# Patient Record
Sex: Female | Born: 1975 | Hispanic: Yes | Marital: Married | Smoking: Never smoker
Health system: Southern US, Community
[De-identification: ages and names within clinical notes are randomized; demographics above are authoritative.]

## PROBLEM LIST (undated history)

## (undated) DIAGNOSIS — M797 Fibromyalgia: Secondary | ICD-10-CM

## (undated) DIAGNOSIS — I10 Essential (primary) hypertension: Secondary | ICD-10-CM

## (undated) DIAGNOSIS — F329 Major depressive disorder, single episode, unspecified: Secondary | ICD-10-CM

## (undated) DIAGNOSIS — K7460 Unspecified cirrhosis of liver: Secondary | ICD-10-CM

## (undated) DIAGNOSIS — T7840XA Allergy, unspecified, initial encounter: Secondary | ICD-10-CM

## (undated) DIAGNOSIS — F32A Depression, unspecified: Secondary | ICD-10-CM

## (undated) DIAGNOSIS — C801 Malignant (primary) neoplasm, unspecified: Secondary | ICD-10-CM

## (undated) DIAGNOSIS — M1990 Unspecified osteoarthritis, unspecified site: Secondary | ICD-10-CM

## (undated) DIAGNOSIS — E7800 Pure hypercholesterolemia, unspecified: Secondary | ICD-10-CM

## (undated) DIAGNOSIS — I499 Cardiac arrhythmia, unspecified: Secondary | ICD-10-CM

## (undated) HISTORY — DX: Fibromyalgia: M79.7

## (undated) HISTORY — DX: Pure hypercholesterolemia, unspecified: E78.00

## (undated) HISTORY — DX: Depression, unspecified: F32.A

## (undated) HISTORY — PX: HERNIA REPAIR: SHX51

## (undated) HISTORY — PX: OTHER SURGICAL HISTORY: SHX169

## (undated) HISTORY — DX: Allergy, unspecified, initial encounter: T78.40XA

## (undated) HISTORY — PX: FRACTURE SURGERY: SHX138

## (undated) HISTORY — PX: WISDOM TOOTH EXTRACTION: SHX21

## (undated) HISTORY — DX: Major depressive disorder, single episode, unspecified: F32.9

## (undated) HISTORY — PX: CHOLECYSTECTOMY: SHX55

---

## 2004-04-08 HISTORY — PX: OTHER SURGICAL HISTORY: SHX169

## 2004-12-11 ENCOUNTER — Encounter: Admission: RE | Admit: 2004-12-11 | Discharge: 2004-12-11 | Payer: Self-pay | Admitting: Specialist

## 2004-12-19 ENCOUNTER — Ambulatory Visit (HOSPITAL_COMMUNITY): Admission: RE | Admit: 2004-12-19 | Discharge: 2004-12-19 | Payer: Self-pay | Admitting: Specialist

## 2009-08-23 ENCOUNTER — Encounter: Admission: RE | Admit: 2009-08-23 | Discharge: 2009-08-23 | Payer: Self-pay | Admitting: Gastroenterology

## 2010-08-24 NOTE — Op Note (Signed)
Elizabeth Norton, Elizabeth Norton               ACCOUNT NO.:  [id number]   MEDICAL RECORD NO.:  [id number]          PATIENT TYPE:  AMB   LOCATION:  DAY                          FACILITY:  [facility]   PHYSICIAN:  Kerrin Champagne, M.D.   DATE OF BIRTH:  Mar 01, 1976   DATE OF PROCEDURE:  12/19/2004  DATE OF DISCHARGE:                                 OPERATIVE REPORT   PREOPERATIVE DIAGNOSIS:  Dorsally tilting apex volar increasing angular  deformity of the right distal radius fracture.   POSTOPERATIVE DIAGNOSIS:  Dorsally tilting apex volar increasing angular  deformity of the right distal radius fracture.   PROCEDURE:  Closed manipulation right distal radius fracture with  percutaneous pinning using 2 x 0.625 K-wires.   SURGEON:  Kerrin Champagne, M.D.   ASSISTANT:  Maud Deed, P.A.-C.   ANESTHESIA:  Camie Patience, Jenelle Mages. Fortune, M.D.   ESTIMATED BLOOD LOSS:  0 cc.   DRAINS:  None.   CLINICAL HISTORY:  The patient is a 35 year old right-hand dominant female  who fell while roller blading some 2-1/2 weeks ago sustaining a right distal  radius fracture was intra-articular and then seven associated ulnar styloid  fracture.  The ulnar styloid fracture is nondisplaced.  The intra-articular  distal radius fracture component is minimally displaced on CT scan.  The  patient does have some slight spread of the scapholunate interval.  This is  comparable to that of the opposite unaffected side.  Because of increasing  angular deformity of the distal radius fracture and collapse of the dorsal  cortex due to comminution, the patient is brought to the operating room to  undergo a closed manipulation and percutaneous pinning to try and prevent  further angular deformity and reestablish length and alignment of the distal  radius.   DESCRIPTION OF PROCEDURE:  After adequate general anesthesia, the patient's  right upper extremity was prepped with DuraPrep solution from fingertips to  the upper forearm, draped  in the usual manner.  The mini C-arm was then  draped in the usual manner.  The C-arm was brought into field and  manipulation performed of the distal radius able to obtain neutral position  alignment of the distal radius fracture site.  An 0.625 K-wire was then  introduced at the tip of the radial styloid and this was then passed across  the distal radius fracture site extending likely from the radial styloid  from dorsal to volar. A similar pin was then passed 3 or 4 mm volar to the  opposite.  This crossed the other pin track and both of these were placed  into the ulnar side of the right distal radius cortex proximal to the  fracture site, fixing the fracture in good position alignment with pins.  Following documentation of the position alignment, following pinning then,  each of the pins were then bent and clipped, and the pin protectors were  then applied.  A portion of Xeroform and then 4x4s placed beneath the pins  to protect the skin.  A well-padded short-arm synthetic cast was then  applied.  The patient was then reactivated, extubated,  and returned recovery  room in satisfactory condition.   POSTOPERATIVE CARE:  The patient will keep the right wrist elevated for two  days.  She will apply ice to the dorsal aspect of the wrist for a period of  two days and use Vicodin one or two q.4-6h. p.r.n. pain and be seen back the  office in the next 1-2 weeks for a follow-up visit.      Kerrin Champagne, M.D.  Electronically Signed     JEN/MEDQ  D:  12/19/2004  T:  12/20/2004  Job:  [id number]

## 2016-12-25 ENCOUNTER — Telehealth: Payer: Self-pay

## 2016-12-25 NOTE — Telephone Encounter (Signed)
Called patient left message for return caaaal

## 2016-12-26 ENCOUNTER — Other Ambulatory Visit: Payer: Self-pay | Admitting: Family Medicine

## 2016-12-26 ENCOUNTER — Encounter: Payer: Self-pay | Admitting: Family Medicine

## 2016-12-26 ENCOUNTER — Telehealth: Payer: Self-pay | Admitting: Family Medicine

## 2016-12-26 ENCOUNTER — Other Ambulatory Visit: Payer: Self-pay

## 2016-12-26 ENCOUNTER — Ambulatory Visit (INDEPENDENT_AMBULATORY_CARE_PROVIDER_SITE_OTHER): Payer: Self-pay | Admitting: Family Medicine

## 2016-12-26 VITALS — BP 112/72 | HR 97 | Temp 97.8°F | Ht 66.0 in | Wt 238.1 lb

## 2016-12-26 DIAGNOSIS — R600 Localized edema: Secondary | ICD-10-CM

## 2016-12-26 DIAGNOSIS — R300 Dysuria: Secondary | ICD-10-CM

## 2016-12-26 LAB — COMPREHENSIVE METABOLIC PANEL
ALK PHOS: 108 U/L (ref 39–117)
ALT: 32 U/L (ref 0–35)
AST: 107 U/L — AB (ref 0–37)
Albumin: 2.5 g/dL — ABNORMAL LOW (ref 3.5–5.2)
BILIRUBIN TOTAL: 6.1 mg/dL — AB (ref 0.2–1.2)
BUN: 9 mg/dL (ref 6–23)
CO2: 25 mEq/L (ref 19–32)
CREATININE: 0.49 mg/dL (ref 0.40–1.20)
Calcium: 8.7 mg/dL (ref 8.4–10.5)
Chloride: 103 mEq/L (ref 96–112)
GFR: 146.66 mL/min (ref >60.00)
GLUCOSE: 92 mg/dL (ref 70–99)
Potassium: 3.7 mEq/L (ref 3.5–5.1)
Sodium: 133 mEq/L — ABNORMAL LOW (ref 135–145)
TOTAL PROTEIN: 6.2 g/dL (ref 6.0–8.3)

## 2016-12-26 LAB — URINALYSIS, ROUTINE W REFLEX MICROSCOPIC
HGB URINE DIPSTICK: NEGATIVE
KETONES UR: NEGATIVE
LEUKOCYTES UA: NEGATIVE
RBC / HPF: NONE SEEN (ref 0–?)
Specific Gravity, Urine: >=1.03 — AB (ref 1.000–1.030)
URINE GLUCOSE: NEGATIVE
Urobilinogen, UA: 1 (ref 0.0–1.0)
WBC UA: NONE SEEN (ref 0–?)
pH: 5.5 (ref 5.0–8.0)

## 2016-12-26 LAB — TSH: TSH: 4.03 u[IU]/mL (ref 0.35–4.50)

## 2016-12-26 NOTE — Addendum Note (Signed)
Addended by: Harley Alto on: 12/26/2016 12:35 PM   Modules accepted: Orders

## 2016-12-26 NOTE — Progress Notes (Signed)
Chief Complaint  Patient presents with  . Establish Care       New Patient Visit SUBJECTIVE: HPI: Elizabeth Norton is an 41 y.o.female who is being seen for establishing care.  1 week of b/l LE swelling, Started shortly after she began a new job. She's been standing a lot more. Her oral intake of salt and fluids is largely unchanged. She's been trying to elevate her legs at home. It is progressively worsening. Is up to her thighs bilaterally. Each leg is affected equally. She denies any history of heart failure, liver failure, renal failure. No injury or prolonged periods of inactivity. No recent medication changes or additions. She's not having any fevers, cough, chest pain, shortness of breath, or dyspnea on exertion.  Allergies  Allergen Reactions  . Penicillins Hives    Had as a child   Past Medical History:  Diagnosis Date  . Fibromyalgia    Past Surgical History:  Procedure Laterality Date  . right wrist fracture     Social History   Social History  . Marital status: Married   Social History Main Topics  . Smoking status: Never Smoker  . Smokeless tobacco: Never Used  . Alcohol use Yes     Comment: occasional  . Drug use: No   Family History  Problem Relation Age of Onset  . Hyperthyroidism Mother   . Hyperthyroidism Sister   . Hyperthyroidism Brother    Takes no medications.  Patient's last menstrual period was 12/10/2016 (approximate).  ROS Cardiovascular: Denies chest pain  Respiratory: Denies dyspnea   OBJECTIVE: BP 112/72 (BP Location: Left Arm, Patient Position: Sitting, Cuff Size: Large)   Pulse 97   Temp 97.8 F (36.6 C) (Oral)   Ht  (1.676 m)   Wt 238 lb 2 oz (108 kg)   LMP 12/10/2016 (Approximate)   SpO2 99%   BMI 38.43 kg/m   Constitutional: -  VS reviewed -  Well developed, well nourished, appears stated age -  No apparent distress  Psychiatric: -  Oriented to person, place, and time -  Memory intact -  Affect and mood normal -   Fluent conversation, good eye contact -  Judgment and insight age appropriate  Eye: -  Conjunctivae clear, no discharge -  Pupils symmetric, round, reactive to light  ENMT: -  MMM    Pharynx moist, no exudate, no erythema  Neck: -  No gross swelling, no palpable masses -  Thyroid midline, not enlarged, mobile, no palpable masses  Cardiovascular: -  RRR -  No bruits -  3+ pitting edema tapering to distal 1/4 of thighs b/l, diffuse TTP  Respiratory: -  Normal respiratory effort, no accessory muscle use, no retraction -  Breath sounds equal, no wheezes, no ronchi, no crackles  Neurological:  -  CN II - XII grossly intact -  Sensation grossly intact to light touch, equal bilaterally  Musculoskeletal: -  No clubbing, no cyanosis -  Neg Homan's b/l  Skin: -  No significant lesion on inspection -  Warm and dry to palpation   ASSESSMENT/PLAN: Bilateral leg edema - Plan: Comprehensive metabolic panel, TSH  Patient instructed to sign release of records form from her previous PCP. Orders as above, compression stocking rx given. Elevate legs. Mind salt intake. Walk as frequently as possible. Pending labs, may call in Lasix.  Patient should return in 4 weeks to recheck swelling. The patient voiced understanding and agreement to the plan.   Jilda Roche [place], DO  12/26/16  9:36 AM

## 2016-12-26 NOTE — Telephone Encounter (Signed)
Order entered/patient informed

## 2016-12-26 NOTE — Telephone Encounter (Signed)
That's fine. OK to place UA.

## 2016-12-26 NOTE — Progress Notes (Signed)
Pre visit review using our clinic review tool, if applicable. No additional management support is needed unless otherwise documented below in the visit note. 

## 2016-12-26 NOTE — Telephone Encounter (Signed)
°  Relation to MV:HQIO Call back number:[phone]  Reason for call:  Patient states she mentioned to CMA at today visit, she was experiencing frequent urination and would like to come back and drop off a urine on her lunch break at 12 noon, please advise

## 2016-12-26 NOTE — Patient Instructions (Addendum)
Stay very active and try to walk as much as possible  Continue to elevate legs.  Wear compression stockings at least 12 hours per day.  Check MyChart tomorrow and Saturday.  Let us know if you need anything.

## 2016-12-27 ENCOUNTER — Telehealth: Payer: Self-pay | Admitting: Family Medicine

## 2016-12-27 NOTE — Telephone Encounter (Signed)
Patient called to inquire of her results/labs and urine

## 2016-12-27 NOTE — Telephone Encounter (Signed)
Called the patient back as she had called again for results Had to leave her a message

## 2016-12-28 ENCOUNTER — Other Ambulatory Visit: Payer: Self-pay | Admitting: Family Medicine

## 2016-12-28 DIAGNOSIS — E871 Hypo-osmolality and hyponatremia: Secondary | ICD-10-CM

## 2016-12-28 DIAGNOSIS — R7401 Elevation of levels of liver transaminase levels: Secondary | ICD-10-CM

## 2016-12-28 DIAGNOSIS — R740 Nonspecific elevation of levels of transaminase and lactic acid dehydrogenase [LDH]: Principal | ICD-10-CM

## 2016-12-28 MED ORDER — FUROSEMIDE 20 MG PO TABS: 20.0000 mg | ORAL_TABLET | Freq: Every day | ORAL | 0 refills | Status: DC

## 2016-12-28 NOTE — Progress Notes (Signed)
Lasix, RUQ [place] and labs ordered per MyChart note.

## 2016-12-28 NOTE — Telephone Encounter (Signed)
Detailed MyChart message sent this AM. See result note for details.

## 2016-12-30 ENCOUNTER — Encounter: Payer: Self-pay | Admitting: Family Medicine

## 2016-12-31 ENCOUNTER — Encounter: Payer: Self-pay | Admitting: Family Medicine

## 2016-12-31 ENCOUNTER — Other Ambulatory Visit (INDEPENDENT_AMBULATORY_CARE_PROVIDER_SITE_OTHER): Payer: Self-pay

## 2016-12-31 DIAGNOSIS — R740 Nonspecific elevation of levels of transaminase and lactic acid dehydrogenase [LDH]: Secondary | ICD-10-CM

## 2016-12-31 DIAGNOSIS — E871 Hypo-osmolality and hyponatremia: Secondary | ICD-10-CM

## 2016-12-31 DIAGNOSIS — R7401 Elevation of levels of liver transaminase levels: Secondary | ICD-10-CM

## 2016-12-31 LAB — FERRITIN: FERRITIN: 205.2 ng/mL (ref 10.0–291.0)

## 2016-12-31 LAB — HEPATIC FUNCTION PANEL
ALBUMIN: 2.5 g/dL — AB (ref 3.5–5.2)
ALT: 32 U/L (ref 0–35)
AST: 105 U/L — AB (ref 0–37)
Alkaline Phosphatase: 116 U/L (ref 39–117)
BILIRUBIN TOTAL: 6.4 mg/dL — AB (ref 0.2–1.2)
Bilirubin, Direct: 2.5 mg/dL — ABNORMAL HIGH (ref 0.0–0.3)
Total Protein: 6.3 g/dL (ref 6.0–8.3)

## 2016-12-31 LAB — IBC PANEL
IRON: 155 ug/dL — AB (ref 42–145)
SATURATION RATIOS: 90 % — AB (ref 20.0–50.0)
TRANSFERRIN: 123 mg/dL — AB (ref 212.0–360.0)

## 2016-12-31 LAB — BASIC METABOLIC PANEL
BUN: 7 mg/dL (ref 6–23)
CHLORIDE: 105 meq/L (ref 96–112)
CO2: 26 mEq/L (ref 19–32)
Calcium: 8.9 mg/dL (ref 8.4–10.5)
Creatinine, Ser: 0.52 mg/dL (ref 0.40–1.20)
GFR: 136.93 mL/min (ref >60.00)
Glucose, Bld: 96 mg/dL (ref 70–99)
POTASSIUM: 3.7 meq/L (ref 3.5–5.1)
SODIUM: 139 meq/L (ref 135–145)

## 2016-12-31 NOTE — Telephone Encounter (Addendum)
Patient would like to speak with nurse directly or PCP regarding results please follow up via phone [phone] (M)

## 2017-01-01 ENCOUNTER — Other Ambulatory Visit: Payer: Self-pay | Admitting: Family Medicine

## 2017-01-01 ENCOUNTER — Telehealth: Payer: Self-pay | Admitting: Family Medicine

## 2017-01-01 LAB — HEPATITIS B SURFACE ANTIGEN: HEP B S AG: NONREACTIVE

## 2017-01-01 LAB — HEPATITIS C ANTIBODY
Hepatitis C Ab: NONREACTIVE
SIGNAL TO CUT-OFF: 0.03 (ref <1.00)

## 2017-01-01 MED ORDER — FUROSEMIDE 40 MG PO TABS: 40.0000 mg | ORAL_TABLET | Freq: Every day | ORAL | 3 refills | Status: DC

## 2017-01-01 NOTE — Telephone Encounter (Signed)
Called left message to call back. Advise on results.

## 2017-01-01 NOTE — Telephone Encounter (Signed)
Pt request order for [place] RUQ to go to [facility]. She says she is self pay and they are cheaper. Forwarded to referral coordinator. It was e-faxed as pt requested. Pt notified via return call [phone].

## 2017-01-01 NOTE — Progress Notes (Signed)
New dose of Lasix called in. Discussed stopping supp Fe. Rx for compression stockings also called in, pt made aware. She still needs to get RUQ [place].

## 2017-01-02 ENCOUNTER — Other Ambulatory Visit: Payer: Self-pay

## 2017-01-07 ENCOUNTER — Telehealth: Payer: Self-pay | Admitting: Family Medicine

## 2017-01-07 NOTE — Telephone Encounter (Signed)
Pt wants ultrasound results she had it at [facility] 01/06/17. Call pt with results. Pt wants a call today. Informed pt Carmelia Roller is out of the office and will return in the morning but pt wants someone to call her today. Sent to [place] and Abner Greenspan since Carmelia Roller is out of the office today.

## 2017-01-07 NOTE — Telephone Encounter (Signed)
Results have not been received yet. Once we receive results Pt will be informed of results.

## 2017-01-07 NOTE — Telephone Encounter (Signed)
Can you check to see if patients results are in

## 2017-01-09 NOTE — Telephone Encounter (Signed)
I called the patient informed to schedule appt to discuss results/she said she cannot get off work right now due to it being a new job.  Would you be able to call her?? Or message on mychart.

## 2017-01-09 NOTE — Telephone Encounter (Signed)
Spoke to the patient and she can be here Monday 01/13/2017 at 6:50 am.

## 2017-01-09 NOTE — Telephone Encounter (Signed)
I would like to see the patient in person to relay the results. If you can schedule her this afternoon, AM 1115 slot, or tomorrow, that would be ideal. TY.

## 2017-01-09 NOTE — Telephone Encounter (Signed)
Is there anyway someone can open the door at this time for the patient please? I am happy to get there early to do it if that is an issue, but I know either of you are usually already there. Thanks!

## 2017-01-09 NOTE — Telephone Encounter (Signed)
I have put her on your schedule for 01/13/2017 at 7 am---noted in appointment notes she will arrive at 6:50 AM.

## 2017-01-09 NOTE — Telephone Encounter (Signed)
Called left message to call back. PCP needs to know the earliest or latest she can come in.

## 2017-01-09 NOTE — Telephone Encounter (Signed)
Called left message to call back 

## 2017-01-10 NOTE — Telephone Encounter (Signed)
That day is Elizabeth Norton day to be here early.

## 2017-01-10 NOTE — Telephone Encounter (Signed)
I will be here to let the pt in

## 2017-01-13 ENCOUNTER — Ambulatory Visit: Payer: Self-pay | Admitting: Family Medicine

## 2017-01-13 DIAGNOSIS — Z0289 Encounter for other administrative examinations: Secondary | ICD-10-CM

## 2017-01-14 ENCOUNTER — Inpatient Hospital Stay (HOSPITAL_COMMUNITY)
Admission: EM | Admit: 2017-01-14 | Discharge: 2017-01-19 | DRG: 433 | Disposition: A | Discharge status: Home / Self Care | Payer: Self-pay | Attending: Oncology | Admitting: Oncology

## 2017-01-14 ENCOUNTER — Emergency Department (HOSPITAL_COMMUNITY): Payer: Self-pay

## 2017-01-14 ENCOUNTER — Encounter (HOSPITAL_COMMUNITY): Payer: Self-pay | Admitting: Emergency Medicine

## 2017-01-14 DIAGNOSIS — D689 Coagulation defect, unspecified: Secondary | ICD-10-CM | POA: Diagnosis present

## 2017-01-14 DIAGNOSIS — Z880 Allergy status to penicillin: Secondary | ICD-10-CM

## 2017-01-14 DIAGNOSIS — K760 Fatty (change of) liver, not elsewhere classified: Secondary | ICD-10-CM | POA: Diagnosis present

## 2017-01-14 DIAGNOSIS — R188 Other ascites: Secondary | ICD-10-CM | POA: Diagnosis present

## 2017-01-14 DIAGNOSIS — E876 Hypokalemia: Secondary | ICD-10-CM | POA: Diagnosis present

## 2017-01-14 DIAGNOSIS — E7800 Pure hypercholesterolemia, unspecified: Secondary | ICD-10-CM | POA: Diagnosis present

## 2017-01-14 DIAGNOSIS — D696 Thrombocytopenia, unspecified: Secondary | ICD-10-CM | POA: Diagnosis present

## 2017-01-14 DIAGNOSIS — Z79899 Other long term (current) drug therapy: Secondary | ICD-10-CM

## 2017-01-14 DIAGNOSIS — D539 Nutritional anemia, unspecified: Secondary | ICD-10-CM | POA: Diagnosis present

## 2017-01-14 DIAGNOSIS — Z91040 Latex allergy status: Secondary | ICD-10-CM

## 2017-01-14 DIAGNOSIS — D891 Cryoglobulinemia: Secondary | ICD-10-CM | POA: Diagnosis present

## 2017-01-14 DIAGNOSIS — K7460 Unspecified cirrhosis of liver: Principal | ICD-10-CM | POA: Diagnosis present

## 2017-01-14 DIAGNOSIS — M797 Fibromyalgia: Secondary | ICD-10-CM | POA: Diagnosis present

## 2017-01-14 DIAGNOSIS — E669 Obesity, unspecified: Secondary | ICD-10-CM | POA: Diagnosis present

## 2017-01-14 DIAGNOSIS — M310 Hypersensitivity angiitis: Secondary | ICD-10-CM | POA: Diagnosis present

## 2017-01-14 DIAGNOSIS — E8809 Other disorders of plasma-protein metabolism, not elsewhere classified: Secondary | ICD-10-CM | POA: Diagnosis present

## 2017-01-14 DIAGNOSIS — I776 Arteritis, unspecified: Secondary | ICD-10-CM

## 2017-01-14 DIAGNOSIS — F329 Major depressive disorder, single episode, unspecified: Secondary | ICD-10-CM | POA: Diagnosis present

## 2017-01-14 DIAGNOSIS — Z6834 Body mass index (BMI) 34.0-34.9, adult: Secondary | ICD-10-CM

## 2017-01-14 DIAGNOSIS — L03115 Cellulitis of right lower limb: Secondary | ICD-10-CM | POA: Diagnosis present

## 2017-01-14 DIAGNOSIS — M25561 Pain in right knee: Secondary | ICD-10-CM | POA: Diagnosis present

## 2017-01-14 DIAGNOSIS — R233 Spontaneous ecchymoses: Secondary | ICD-10-CM | POA: Diagnosis present

## 2017-01-14 DIAGNOSIS — E871 Hypo-osmolality and hyponatremia: Secondary | ICD-10-CM | POA: Diagnosis present

## 2017-01-14 DIAGNOSIS — R21 Rash and other nonspecific skin eruption: Secondary | ICD-10-CM | POA: Diagnosis present

## 2017-01-14 DIAGNOSIS — Z7289 Other problems related to lifestyle: Secondary | ICD-10-CM

## 2017-01-14 DIAGNOSIS — D692 Other nonthrombocytopenic purpura: Secondary | ICD-10-CM | POA: Diagnosis present

## 2017-01-14 HISTORY — DX: Unspecified cirrhosis of liver: K74.60

## 2017-01-14 LAB — COMPREHENSIVE METABOLIC PANEL
ALBUMIN: 2.6 g/dL — AB (ref 3.5–5.0)
ALK PHOS: 83 U/L (ref 38–126)
ALT: 34 U/L (ref 14–54)
ANION GAP: 14 (ref 5–15)
AST: 99 U/L — AB (ref 15–41)
BILIRUBIN TOTAL: 6.3 mg/dL — AB (ref 0.3–1.2)
BUN: 10 mg/dL (ref 6–20)
CALCIUM: 8.4 mg/dL — AB (ref 8.9–10.3)
CO2: 27 mmol/L (ref 22–32)
Chloride: 85 mmol/L — ABNORMAL LOW (ref 101–111)
Creatinine, Ser: 0.74 mg/dL (ref 0.44–1.00)
GFR calc Af Amer: >60 mL/min (ref >60)
GFR calc non Af Amer: >60 mL/min (ref >60)
GLUCOSE: 98 mg/dL (ref 65–99)
POTASSIUM: 2.7 mmol/L — AB (ref 3.5–5.1)
SODIUM: 126 mmol/L — AB (ref 135–145)
Total Protein: 7 g/dL (ref 6.5–8.1)

## 2017-01-14 LAB — URINALYSIS, ROUTINE W REFLEX MICROSCOPIC
BILIRUBIN URINE: NEGATIVE
Glucose, UA: NEGATIVE mg/dL
Hgb urine dipstick: NEGATIVE
KETONES UR: NEGATIVE mg/dL
Leukocytes, UA: NEGATIVE
NITRITE: NEGATIVE
PROTEIN: NEGATIVE mg/dL
SPECIFIC GRAVITY, URINE: 1.014 (ref 1.005–1.030)
pH: 5 (ref 5.0–8.0)

## 2017-01-14 LAB — SAVE SMEAR

## 2017-01-14 LAB — CBC WITH DIFFERENTIAL/PLATELET
BASOS ABS: 0 10*3/uL (ref 0.0–0.1)
Basophils Relative: 0 %
EOS ABS: 0.1 10*3/uL (ref 0.0–0.7)
EOS PCT: 1 %
HEMATOCRIT: 30.2 % — AB (ref 36.0–46.0)
HEMOGLOBIN: 11 g/dL — AB (ref 12.0–15.0)
Lymphocytes Relative: 18 %
Lymphs Abs: 1.7 10*3/uL (ref 0.7–4.0)
MCH: 36.5 pg — ABNORMAL HIGH (ref 26.0–34.0)
MCHC: 36.4 g/dL — AB (ref 30.0–36.0)
MCV: 100.3 fL — ABNORMAL HIGH (ref 78.0–100.0)
Monocytes Absolute: 1.1 10*3/uL — ABNORMAL HIGH (ref 0.1–1.0)
Monocytes Relative: 11 %
Neutro Abs: 6.5 10*3/uL (ref 1.7–7.7)
Neutrophils Relative %: 70 %
Platelets: 83 10*3/uL — ABNORMAL LOW (ref 150–400)
RBC: 3.01 MIL/uL — ABNORMAL LOW (ref 3.87–5.11)
RDW: 14.5 % (ref 11.5–15.5)
WBC: 9.4 10*3/uL (ref 4.0–10.5)

## 2017-01-14 LAB — SEDIMENTATION RATE: Sed Rate: 65 mm/hr — ABNORMAL HIGH (ref 0–22)

## 2017-01-14 LAB — BRAIN NATRIURETIC PEPTIDE: B NATRIURETIC PEPTIDE 5: 264.2 pg/mL — AB (ref 0.0–100.0)

## 2017-01-14 LAB — C-REACTIVE PROTEIN: CRP: 5.9 mg/dL — AB (ref <1.0)

## 2017-01-14 LAB — D-DIMER, QUANTITATIVE: D-Dimer, Quant: 9.72 ug/mL-FEU — ABNORMAL HIGH (ref 0.00–0.50)

## 2017-01-14 LAB — PROTIME-INR
INR: 2.15
PROTHROMBIN TIME: 23.9 s — AB (ref 11.4–15.2)

## 2017-01-14 LAB — FIBRINOGEN: FIBRINOGEN: 198 mg/dL — AB (ref 210–475)

## 2017-01-14 LAB — I-STAT CG4 LACTIC ACID, ED
Lactic Acid, Venous: 2.43 mmol/L (ref 0.5–1.9)
Lactic Acid, Venous: 1.82 mmol/L (ref 0.5–1.9)

## 2017-01-14 LAB — APTT: aPTT: 50 seconds — ABNORMAL HIGH (ref 24–36)

## 2017-01-14 MED ORDER — POTASSIUM CHLORIDE CRYS ER 20 MEQ PO TBCR: 40.0000 meq | EXTENDED_RELEASE_TABLET | Freq: Once | ORAL | Status: DC

## 2017-01-14 MED ORDER — VANCOMYCIN HCL IN DEXTROSE 1-5 GM/200ML-% IV SOLN
1000.0000 mg | Freq: Once | INTRAVENOUS | Status: AC
  Administered 2017-01-14: 1000 mg via INTRAVENOUS
  Filled 2017-01-14: qty 200

## 2017-01-14 MED ORDER — POTASSIUM CHLORIDE 10 MEQ/100ML IV SOLN
10.0000 meq | INTRAVENOUS | Status: AC
  Administered 2017-01-14 – 2017-01-15 (×2): 10 meq via INTRAVENOUS
  Filled 2017-01-14 (×2): qty 100

## 2017-01-14 MED ORDER — POTASSIUM CHLORIDE CRYS ER 20 MEQ PO TBCR
40.0000 meq | EXTENDED_RELEASE_TABLET | Freq: Once | ORAL | Status: AC
  Administered 2017-01-14: 40 meq via ORAL
  Filled 2017-01-14: qty 2

## 2017-01-14 MED ORDER — POTASSIUM CHLORIDE 10 MEQ/100ML IV SOLN
10.0000 meq | INTRAVENOUS | Status: AC
  Administered 2017-01-14: 10 meq via INTRAVENOUS
  Filled 2017-01-14: qty 100

## 2017-01-14 MED ORDER — DEXTROSE 5 % IV SOLN
2.0000 g | Freq: Three times a day (TID) | INTRAVENOUS | Status: DC
  Filled 2017-01-14: qty 2

## 2017-01-14 NOTE — ED Triage Notes (Signed)
Pt st's she was admitted 2 days ago for vasculitis in right lower leg at [facility].  Pt st's they wanted to transfer her to [facility] but she did not want to go so she signed out AMA earlier today.  Pt c/o pain and swelling to right lower leg.  Leg red and warm to touch.  Pt did receive one dose of Aztreonam while at [facility]

## 2017-01-14 NOTE — H&P (Signed)
Date: 01/14/2017               Patient Name:  Elizabeth Norton MRN: [id number]  DOB: 23-May-1975 Age / Sex: 41 y.o., female   PCP: Shelda Pal, DO         Medical Service: Internal Medicine Teaching Service         Attending Physician: Dr. Annia Belt, MD    First Contact: Dr. Shan Levans Pager: [phone]  Second Contact: Dr. Jari Favre Pager: [phone]       After Hours (After 5p/  First Contact Pager: [phone]  weekends / holidays): Second Contact Pager: [phone]   Chief Complaint: Right lower extremity swelling and erythema   History of Present Illness: Elizabeth Norton is a 41 y.o. Previously healthy female who presented to the ED with worsening right lower extremity swelling and erythema of 3 days duration.   Over the past 2 months she has had multiple medical issues. At the beginning of August she was evaluated by her PCP for decreased urinary output and blood work revealed abnormal liver enzymes. She was started on lasix, and a RUQ ultrasound was obtained that illustrated coarsened hepatic echogenicity with nodularity consistent with hepatic cirrhosis along with ascites and a small right pleural effusion. Work-up was initiated, including a ceuroplasmin, copper level, and ANA with reflex. However, lab results are not available at this point. Her father was diagnosed with cirrhosis however, it was felt to be due to chronic hepatitis C and alcohol abuse. No other family members have been diagnosed with cirrhosis or liver related pathology. She does use alcohol extensively but states that she has not done so all her life. She just recently started drinking heavily the past 6 months, including tequila, beer, and wine. She has never had a blood transfusion.   Over the past 2 weeks she has noticed increased swelling in her bilateral lower extremities with associated skin changes including erythema and dark patches. Her left leg subsequently improved with no treatment but her right leg  has become significantly worse. Initially elevation helped with the swelling and discoloration however, it does not appear to help now. She has never had this before, and nothing seems to help. 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 She was elevated at [facility] on 10/8, she was not given any antibiotics and instead wanted to transfer her to [facility] for vasculitis evaluation. She asked instead to be transfer to [facility] and subsequently left to come here. It is associated with severe pain even to light touch.   She does have some visual changes but attributes them to age, stating that she cannot read far away things anymore. She also is experiencing some right knee pain that she attributes to the acute process occurring, but denies any other arthralgias or myalgias. She denies recent fevers, chills, amaurosis fugax, cough, hemoptysis, persistent headaches, recurrent sinus infections, oral ulcers, rash on the face or other skin changes, history of STIs, blood diarrhea (had once several years ago with no recurrence), abdominal pain, hematuria (once with a UTI no recurrence), seizures, recent travel, and has never left the country. Family history is significant for autoimmune thyroiditis including her mother and all her siblings. No other family history of autoimmune, lung, or liver pathology.   Meds:  Current Meds  Medication Sig  . furosemide (LASIX) 40 MG tablet Take 1 tablet (40 mg total) by mouth daily. (Patient taking differently: Take 80  mg by mouth daily. )  . Potassium (POTASSIMIN PO) Take 1 tablet by mouth 2 (two) times daily.   Allergies: Allergies as of 01/14/2017 - Review Complete 01/14/2017  Allergen Reaction Noted  . Penicillins Hives 12/26/2016  . Latex Itching 01/13/2017  . Penicillin g Rash 01/13/2017   Past Medical History:  Diagnosis Date  . Cirrhosis of liver ([place])   . Depression   . Fibromyalgia   . 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   Physical Exam: Blood pressure (!) 99/56, pulse 75, temperature 98.1 F (36.7 C), temperature source Oral, resp. rate 18, height _0  (1.676 m), weight 216 lb 4.3 oz (98.1 kg), last menstrual period 12/12/2016, SpO2 100 %.  General: Well developed, well nourished female resting comfortably in bed in no acute distress  HENT: + icterus, oropharynx clear with no exudates, no gum bleeding, no petechia on the soft or hard palate, moist mucus membranes, no LAD Pulm: Good air movement, no whezing or crackles appreciated CV: RRR, normal S1 and S1, no S3 or S4, no murmurs no rubs  GI: Soft, non-distended, no fluid wave, + hepatosplenomegaly, no other stigmatas of liver cirrhosis noted.  Extremities: Right lower extremity with non-pitting edema, erythema, and palpable purpura (picture below, with additional in media tab). Left lower extremity with non-pitting edema. Pulses not palpable in the LEs but in the UE are intact.   Media Information     Right Tibia/Fibula X-ray  IMPRESSION: 1. Diffuse soft tissue swelling. 2. No acute bone abnormality peer  Assessment & Plan by Problem: Active Problems:   Vasculitis (HCC)   Hepatic cirrhosis (HCC)  Liver Cirrhosis. Elizabeth Norton was recently diagnosed with liver cirrhosis with confirmatory findings on abdominal ultrasound. The underlying etiology has yet to be discovered. While she has used excessive amount of alcohol over the past several months it is unlikely this is what caused her liver cirrhosis. Previously check hepatitis panel on 10/9 were negative for both hepatitis B  and C ruling out common viral causes. She has no recent travel and has never left the country making me less concerns for infections such as Echinococcosis and Schoistosomiasis. Ferritin was measured and not found to be grossly elevated, although her iron saturation is >90%, making hemochromatosis less likely. CBC with diff illustrating no peripheral eosinophilia making Churg-Straus less likely. Labs including ceuroplasmin, copper level, and ANA with reflex were sent out from [place] center and will need to be followed up. She has not been on or started any medication that are known to be hepatotoxic. While she does have a history of childhood asthma Anit-trypsin 1 deficiency is less likely given no family history and no current lung issues. She will need a hepatologist.   - Smooth muscle, anti-centromere, anti-mitochondrial, ANCA, PR3 to rule out autoimmune hepatitis, PBC, and PSC - Call [facility] medical center to follow-up on ceuroplasmin, copper level, and ANA  - Current labs are concerning including elevated coagulation panel (PT 23.9 INR 2.15), thrombopenia, increased total bilirubin, hyponatremia, borderline low fibrinogen, and elevated D-dimer  - MELD-Na score of 28 - Consult GI/Hepatology   Possible Vasculitis.  Ms. Alcott right lower extremity skin changes are concerning for underlying vasculitis as opposed to an underlying cellulitis. The palpable purpura is of concerns. Given her rapidly progressing liver cirrhosis off unknown etiology, new skin lesions, and elevated ESR/CRP vasculitis/autoimmune etiology is of concern. She is a febrile and has no other systemic signs of infection. We hold antibiotics at this point and proceed with a vasculitis work-up. The dermatologists office needs to be called for the results of her skin biopsy.   - C3, C4, UA, ANCA, ANA, MPO, PR3, immunoglobulin levels  - Call her dermatologist office to find out results of her skin biopsy  Macrocytic Anemia  - Hgb  11.0, elevated MCV 100.3 - TSH 4.03 on 9/20  - Likely due to her hepatic disease  Electrolyte Derangements  - Hypokalemia: holding lasix and replacing as needed.   Diet: Regular VTE ppx: Holding due to coagulopathy  Code Status: Full Code  Dispo: Admit patient to Inpatient with expected length of stay greater than 2 midnights.  Signed: Ina Homes, MD 01/14/2017, 9:50 PM  My Pager: [phone]

## 2017-01-14 NOTE — Progress Notes (Signed)
Pharmacy Antibiotic Note  Elizabeth Norton is a 41 y.o. female admitted on 01/14/2017 with wound infection.  Pharmacy has been consulted for Aztreonam dosing. Seen at [facility] for vasculitis and received 1 dose of aztreonam.  Afebrile, LA 2.43, WBC 9.4, Scr 0.74. Vanc  IV X1 in ED.  Plan: Aztreonam 2g IV every 8 hours  Monitor for Scr, c/s, clinical resolution. F/u de-escalation plan/LOT Follow up need to continue Vanc or other g+ coverage.    Height:  (167.6 cm) Weight: 220 lb (99.8 kg) IBW/kg (Calculated) : 59.3  Temp (24hrs), Avg:98.8 F (37.1 C), Min:98.1 F (36.7 C), Max:99.5 F (37.5 C)   Recent Labs Lab 01/14/17 1552 01/14/17 1606  WBC 9.4  --   CREATININE 0.74  --   LATICACIDVEN  --  2.43*    Estimated Creatinine Clearance: 109.2 mL/min (by C-G formula based on SCr of 0.74 mg/dL).    Allergies  Allergen Reactions  . Penicillins Hives    Had as a child Has patient had a PCN reaction causing immediate rash, facial/tongue/throat swelling, SOB or lightheadedness with hypotension: No Has patient had a PCN reaction causing severe rash involving mucus membranes or skin necrosis: No Has patient had a PCN reaction that required hospitalization: No Has patient had a PCN reaction occurring within the last 10 years: No If all of the above answers are "NO", then may proceed with Cephalosporin use.  . Latex Itching  . Penicillin G Rash    Antimicrobials this admission: Aztreonam 10/9 >>  Vancomycin 10/9 >>     Microbiology results: 10/9 BCx:    Thank you for allowing pharmacy to be a part of this patient's care.  Emeline General, PharmD Candidate  01/14/2017 5:23 PM

## 2017-01-14 NOTE — ED Notes (Signed)
Callie-RN @ [facility] notified of elevated CG-4

## 2017-01-14 NOTE — ED Provider Notes (Signed)
[facility] DEPT Provider Note   CSN: [id number] Arrival date & time: 01/14/17  1520     History   Chief Complaint Chief Complaint  Patient presents with  . Leg Pain    HPI Elizabeth Norton is a 41 y.o. female history of cirrhosis, fibromyalgia, high cholesterol here presenting worsening right lower from the redness and swelling. Patient states that over the last 4 days, she had a rapidly progressive swelling and redness to the right lower extremity.  Started about 4 days ago behind her right knee cap and over the last 3 days, progressive to the entire right calf area. Patient went to the [facility] yesterday and was admitted for possible vasculitis versus cellulitis. Patient was given vanc, aztreonam. Had RLE US done that showed no DVT. Had hepatitis panel that was negative. Hospitalist wanted to transfer patient to [facility] to see rheumatology and vascular surgeon but patient refused and just wanted to stay there so she signed out against medical advice and came here. Denies any fever. States that there redness has progressive beyond the area they drew around yesterday.   The history is provided by the patient.    Past Medical History:  Diagnosis Date  . Cirrhosis of liver ([place])   . Depression   . Fibromyalgia   . High cholesterol     There are no active problems to display for this patient.   Past Surgical History:  Procedure Laterality Date  . right wrist fracture      OB History    No data available       Home Medications    Prior to Admission medications   Medication Sig Start Date End Date Taking? Authorizing Provider  furosemide (LASIX) 40 MG tablet Take 1 tablet (40 mg total) by mouth daily. Patient taking differently: Take 80 mg by mouth daily.  01/01/17  Yes Shelda Pal, DO  Potassium (POTASSIMIN PO) Take 1 tablet by mouth 2 (two) times daily.   Yes [provider]    Family History Family History  Problem Relation Age of  Onset  . Hyperthyroidism Mother   . Hyperthyroidism Sister   . Hyperthyroidism Brother     Social History Social History  Substance Use Topics  . Smoking status: Never Smoker  . Smokeless tobacco: Never Used  . Alcohol use Yes     Comment: occasional     Allergies   Penicillins; Latex; and Penicillin g   Review of Systems Review of Systems  Skin: Positive for color change.  All other systems reviewed and are negative.    Physical Exam Updated Vital Signs BP 112/68   Pulse 87   Temp 98.1 F (36.7 C) (Oral)   Resp 18   Ht _0  (1.676 m)   Wt 99.8 kg (220 lb)   LMP 12/12/2016 (Exact Date)   SpO2 99%   BMI 35.51 kg/m   Physical Exam  Constitutional: She appears well-developed.  HENT:  Head: Normocephalic.  Mouth/Throat: Oropharynx is clear and moist.  Eyes: Pupils are equal, round, and reactive to light. Conjunctivae and EOM are normal.  Neck: Normal range of motion. Neck supple.  Cardiovascular: Normal rate, regular rhythm and normal heart sounds.   Pulmonary/Chest: Effort normal and breath sounds normal. No respiratory distress. She has no wheezes.  Abdominal: Soft. Bowel sounds are normal. She exhibits no distension. There is no tenderness. There is no guarding.  Musculoskeletal:  R lower leg with redness and some warmth. ? Petechiae and purpura  as well. R leg swollen, mildly tender, no obvious subcutaneous air. 2+ pulses. R thigh biopsy site healing well with no obvious surrounding cellulitis   Neurological: She is alert.  Skin: Skin is warm. There is erythema.  Psychiatric: She has a normal mood and affect.  Nursing note and vitals reviewed.    ED Treatments / Results  Labs (all labs ordered are listed, but only abnormal results are displayed) Labs Reviewed  CBC WITH DIFFERENTIAL/PLATELET - Abnormal; Notable for the following:       Result Value   RBC 3.01 (*)    Hemoglobin 11.0 (*)    HCT 30.2 (*)    MCV 100.3 (*)    MCH 36.5 (*)    MCHC 36.4  (*)    Platelets 83 (*)    Monocytes Absolute 1.1 (*)    All other components within normal limits  COMPREHENSIVE METABOLIC PANEL - Abnormal; Notable for the following:    Sodium 126 (*)    Potassium 2.7 (*)    Chloride 85 (*)    Calcium 8.4 (*)    Albumin 2.6 (*)    AST 99 (*)    Total Bilirubin 6.3 (*)    All other components within normal limits  PROTIME-INR - Abnormal; Notable for the following:    Prothrombin Time 23.9 (*)    All other components within normal limits  BRAIN NATRIURETIC PEPTIDE - Abnormal; Notable for the following:    B Natriuretic Peptide 264.2 (*)    All other components within normal limits  I-STAT CG4 LACTIC ACID, ED - Abnormal; Notable for the following:    Lactic Acid, Venous 2.43 (*)    All other components within normal limits  CULTURE, BLOOD (ROUTINE X 2)  CULTURE, BLOOD (ROUTINE X 2)  SEDIMENTATION RATE  C-REACTIVE PROTEIN  I-STAT CG4 LACTIC ACID, ED    EKG  EKG Interpretation None       Radiology Dg Tibia/fibula Right  Result Date: 01/14/2017 CLINICAL DATA:  Right leg cellulitis. EXAM: RIGHT TIBIA AND FIBULA - 2 VIEW COMPARISON:  None FINDINGS: There is no acute fracture or subluxation. Mild degenerative changes identified within the knee. No acute bone abnormality identified. No focal bone erosions. IMPRESSION: 1. Diffuse soft tissue swelling. 2. No acute bone abnormality peer Electronically Signed   By: Kerby Moors M.D.   On: 01/14/2017 18:07    Procedures Procedures (including critical care time)  Medications Ordered in ED Medications  vancomycin (VANCOCIN) IVPB 1000 mg/200 mL premix (1,000 mg Intravenous New Bag/Given 01/14/17 1814)  potassium chloride 10 mEq in 100 mL IVPB (10 mEq Intravenous New Bag/Given 01/14/17 1814)  aztreonam (AZACTAM) 2 g in dextrose 5 % 50 mL IVPB (not administered)  potassium chloride SA (K-DUR,KLOR-CON) CR tablet 40 mEq (40 mEq Oral Given 01/14/17 1807)     Initial Impression / Assessment and Plan /  ED Course  I have reviewed the triage vital signs and the nursing notes.  Pertinent labs & imaging results that were available during my care of the patient were reviewed by me and considered in my medical decision making (see chart for details).     Elizabeth Norton is a 41 y.o. female here with vasculitis vs cellulitis. Was on vanc/aztreonam and redness still increasing. Patient was admitted to [facility] and refused to be transferred to [facility] and wants to come here instead. Consider worsening cellulitis vs vasculitis. Will repeat labs, ESR, CRP, lactate, cultures, xrays. Had DVT study that was negative. Has  underlying cirrhosis but hepatitis panel negative yesterday and hasn't had liver biopsy yet.   7:01 PM Lactate 2.4. INR 2.1. K 2.7, supplemented. Xray showed no osteo or air. Ordered vanc/aztreonam. Will admit for cellulitis vs vasculitis    Final Clinical Impressions(s) / ED Diagnoses   Final diagnoses:  Cellulitis of leg, right    New Prescriptions New Prescriptions   No medications on file     Drenda Freeze, MD 01/14/17 1901

## 2017-01-15 DIAGNOSIS — M797 Fibromyalgia: Secondary | ICD-10-CM

## 2017-01-15 DIAGNOSIS — Z91040 Latex allergy status: Secondary | ICD-10-CM

## 2017-01-15 DIAGNOSIS — Z880 Allergy status to penicillin: Secondary | ICD-10-CM

## 2017-01-15 DIAGNOSIS — E876 Hypokalemia: Secondary | ICD-10-CM

## 2017-01-15 LAB — COMPREHENSIVE METABOLIC PANEL
ALBUMIN: 2 g/dL — AB (ref 3.5–5.0)
ALK PHOS: 62 U/L (ref 38–126)
ALT: 25 U/L (ref 14–54)
ANION GAP: 9 (ref 5–15)
AST: 71 U/L — ABNORMAL HIGH (ref 15–41)
BILIRUBIN TOTAL: 5.4 mg/dL — AB (ref 0.3–1.2)
BUN: 11 mg/dL (ref 6–20)
CALCIUM: 7.8 mg/dL — AB (ref 8.9–10.3)
CO2: 30 mmol/L (ref 22–32)
CREATININE: 0.76 mg/dL (ref 0.44–1.00)
Chloride: 89 mmol/L — ABNORMAL LOW (ref 101–111)
GFR calc non Af Amer: >60 mL/min (ref >60)
GLUCOSE: 107 mg/dL — AB (ref 65–99)
Potassium: 2.8 mmol/L — ABNORMAL LOW (ref 3.5–5.1)
SODIUM: 128 mmol/L — AB (ref 135–145)
TOTAL PROTEIN: 5.5 g/dL — AB (ref 6.5–8.1)

## 2017-01-15 LAB — HIV ANTIBODY (ROUTINE TESTING W REFLEX): HIV Screen 4th Generation wRfx: NONREACTIVE

## 2017-01-15 LAB — PROTIME-INR
INR: 2.39
Prothrombin Time: 25.9 seconds — ABNORMAL HIGH (ref 11.4–15.2)

## 2017-01-15 LAB — CBC
HCT: 24.2 % — ABNORMAL LOW (ref 36.0–46.0)
Hemoglobin: 8.6 g/dL — ABNORMAL LOW (ref 12.0–15.0)
MCH: 35.5 pg — ABNORMAL HIGH (ref 26.0–34.0)
MCHC: 35.5 g/dL (ref 30.0–36.0)
MCV: 100 fL (ref 78.0–100.0)
Platelets: 67 10*3/uL — ABNORMAL LOW (ref 150–400)
RBC: 2.42 MIL/uL — ABNORMAL LOW (ref 3.87–5.11)
RDW: 14.2 % (ref 11.5–15.5)
WBC: 6.9 10*3/uL (ref 4.0–10.5)

## 2017-01-15 LAB — GLUCOSE, CAPILLARY: Glucose-Capillary: 87 mg/dL (ref 65–99)

## 2017-01-15 LAB — MAGNESIUM: MAGNESIUM: 1.8 mg/dL (ref 1.7–2.4)

## 2017-01-15 MED ORDER — IBUPROFEN 600 MG PO TABS
600.0000 mg | ORAL_TABLET | Freq: Four times a day (QID) | ORAL | Status: DC | PRN
  Administered 2017-01-15: 600 mg via ORAL
  Filled 2017-01-15: qty 1

## 2017-01-15 MED ORDER — SODIUM CHLORIDE 0.9 % IV SOLN
INTRAVENOUS | Status: DC
  Administered 2017-01-15: 23:00:00 via INTRAVENOUS

## 2017-01-15 MED ORDER — CEFAZOLIN SODIUM-DEXTROSE 1-4 GM/50ML-% IV SOLN
1.0000 g | Freq: Three times a day (TID) | INTRAVENOUS | Status: DC
  Administered 2017-01-15 – 2017-01-16 (×4): 1 g via INTRAVENOUS
  Filled 2017-01-15 (×6): qty 50

## 2017-01-15 MED ORDER — POTASSIUM CHLORIDE CRYS ER 20 MEQ PO TBCR
40.0000 meq | EXTENDED_RELEASE_TABLET | Freq: Two times a day (BID) | ORAL | Status: AC
  Administered 2017-01-15 (×2): 40 meq via ORAL
  Filled 2017-01-15 (×2): qty 2

## 2017-01-15 MED ORDER — DIPHENHYDRAMINE HCL 25 MG PO CAPS
25.0000 mg | ORAL_CAPSULE | Freq: Once | ORAL | Status: DC
Start: 2017-01-15 — End: 2017-01-15

## 2017-01-15 MED ORDER — POTASSIUM CHLORIDE 10 MEQ/100ML IV SOLN
10.0000 meq | INTRAVENOUS | Status: AC
  Administered 2017-01-15 (×4): 10 meq via INTRAVENOUS
  Filled 2017-01-15 (×4): qty 100

## 2017-01-15 MED ORDER — SODIUM CHLORIDE 0.9 % IV SOLN: INTRAVENOUS | Status: DC

## 2017-01-15 NOTE — Progress Notes (Signed)
   Subjective: no acute events overnight, pt denies any shortness of breath,  chest pain, nausea/vomiting or diarrhea.  She is complaining of pain in her right leg but does not have any other complaints.  Objective:  Vital signs in last 24 hours: Vitals:   01/14/17 1700 01/14/17 2047 01/14/17 2106 01/15/17 0522  BP: 112/68 (!) 99/56  (!) 102/58  Pulse: 87 75  73  Resp: '18 18  18  '$ Temp:  98.1 F (36.7 C)  97.7 F (36.5 C)  TempSrc:  Oral  Oral  SpO2: 99% 100%  98%  Weight:   216 lb 4.3 oz (98.1 kg)   Height:   '5\' 6"'$  (1.676 m)    Physical Exam  Constitutional: She is oriented to person, place, and time. No distress.  Eyes: Right eye exhibits no discharge. Left eye exhibits no discharge. Scleral icterus is present.  Cardiovascular: Normal rate, regular rhythm and normal heart sounds.  Exam reveals no gallop and no friction rub.   No murmur heard. Pulmonary/Chest: Effort normal and breath sounds normal. No respiratory distress. She has no wheezes. She has no rales. She exhibits no tenderness.  Abdominal: Soft. Bowel sounds are normal. She exhibits no distension and no mass. There is no tenderness. There is no rebound and no guarding.  Musculoskeletal: She exhibits edema (bilateral LE edema worse on right).  Neurological: She is alert and oriented to person, place, and time.  Skin: She is not diaphoretic.     See HandP or media tab for images, pt has palpable purpura and petichial circumferential rash below the right knee down to the footl.  It is edematous and painful to touch.      Assessment/Plan:  Active Problems:   Vasculitis ([place])   Hepatic cirrhosis ([facility])  Hepatic Cirrhosis: recent diagnosis, etiology yet to be determined.  Pt has a history of excessive alcohol use over the last several months, hepatitis panel is negative, no recent travel, ferriting upper limit of normal.    -Smooth muscle, anti-centromere, anti-mitochondrial, ANCA, PR3 to rule out autoimmune hepatitis,  PBC, and PSC -MELD score 28 -pt has low albumin, low platelets, elevated PT, elevated bilirubin, low normal fibrinogen and elevated D dimer -contacted [facility] awaiting results for lab tests done there: ANA, ceruloplasmin, copper   Petechial rash with palpable purpura: possibly vasculitis, no systemic signs of infection, elevated ESR and CRP.    -C3, C4, UA, ANCA, ANA, MPO, PR3, immunoglobulin levels  -biopsy results will be available tomorrow morning  Macrocytic Anemia: could be secondary to alcohol abuse or cirrhosis  -will review smear today for further details -Hgb 11.0 MCV 100.3  Electrolyte Derangements  - Hypokalemia: holding lasix and replacing as needed.    Dispo: Anticipated discharge in approximately 2 day(s).   Katherine Roan, MD 01/15/2017, 10:48 AM Vickki Muff MD PGY-1 Internal Medicine Pager # [phone]

## 2017-01-15 NOTE — Progress Notes (Signed)
Patient arrived to the unit via bed from the emergency department.  Patient is alert and oriented x 4. Temp: 98.1; BP:99/56; Pulse: 75; Resp 18 and 100% on room air. Complaints of right leg pain but denies any intervention at this time.  Skin assessment complete. Scelera yellow bilaterally. Right lower extremity erythema with edema.  Stitches to the right lower extremity from a recent biopsy.  IV intact to the right wrist.  Educated the patient on how to reach the staff on the unit.  Lowered the bed and placed the call light within reach.  Will continue to monitor the patient.

## 2017-01-15 NOTE — Progress Notes (Signed)
pt spiked fever 102.9 at 5pm 01/15/17 . Evaluated pt at bedside, pt not in distress, she was having chills not feeling well. Pt not dyspneic or tachypneic,  no chest pain, not tachycardic. No pain in leg at rest, leg warm and painful to touch, edematous, erythematous.  started ancef 1g every 8 hours for presumed cellulitis.  Discussed in detail about pts childhood allergy to penicillin pt broke out in a rash when she was 41 years old when given penicillin, no laryngeal edema.  No issues with penicillin since then.  Went over treatment plan with pt and her husband in great detail.

## 2017-01-16 ENCOUNTER — Inpatient Hospital Stay (HOSPITAL_COMMUNITY): Payer: Self-pay

## 2017-01-16 DIAGNOSIS — D689 Coagulation defect, unspecified: Secondary | ICD-10-CM

## 2017-01-16 DIAGNOSIS — K7460 Unspecified cirrhosis of liver: Principal | ICD-10-CM

## 2017-01-16 DIAGNOSIS — R188 Other ascites: Secondary | ICD-10-CM

## 2017-01-16 DIAGNOSIS — D696 Thrombocytopenia, unspecified: Secondary | ICD-10-CM

## 2017-01-16 DIAGNOSIS — R7989 Other specified abnormal findings of blood chemistry: Secondary | ICD-10-CM

## 2017-01-16 DIAGNOSIS — D509 Iron deficiency anemia, unspecified: Secondary | ICD-10-CM

## 2017-01-16 DIAGNOSIS — E871 Hypo-osmolality and hyponatremia: Secondary | ICD-10-CM

## 2017-01-16 LAB — BASIC METABOLIC PANEL
Anion gap: 8 (ref 5–15)
BUN: 11 mg/dL (ref 6–20)
CALCIUM: 8.3 mg/dL — AB (ref 8.9–10.3)
CO2: 28 mmol/L (ref 22–32)
CREATININE: 0.91 mg/dL (ref 0.44–1.00)
Chloride: 93 mmol/L — ABNORMAL LOW (ref 101–111)
GFR calc Af Amer: >60 mL/min (ref >60)
Glucose, Bld: 111 mg/dL — ABNORMAL HIGH (ref 65–99)
POTASSIUM: 3.4 mmol/L — AB (ref 3.5–5.1)
SODIUM: 129 mmol/L — AB (ref 135–145)

## 2017-01-16 LAB — ANTI-SMOOTH MUSCLE ANTIBODY, IGG: F-Actin IgG: 26 Units — ABNORMAL HIGH (ref 0–19)

## 2017-01-16 LAB — CBC
HEMATOCRIT: 25.4 % — AB (ref 36.0–46.0)
Hemoglobin: 8.9 g/dL — ABNORMAL LOW (ref 12.0–15.0)
MCH: 35.3 pg — ABNORMAL HIGH (ref 26.0–34.0)
MCHC: 35 g/dL (ref 30.0–36.0)
MCV: 100.8 fL — ABNORMAL HIGH (ref 78.0–100.0)
PLATELETS: 73 10*3/uL — AB (ref 150–400)
RBC: 2.52 MIL/uL — ABNORMAL LOW (ref 3.87–5.11)
RDW: 14.5 % (ref 11.5–15.5)
WBC: 8.1 10*3/uL (ref 4.0–10.5)

## 2017-01-16 LAB — C4 COMPLEMENT: COMPLEMENT C4, BODY FLUID: 10 mg/dL — AB (ref 14–44)

## 2017-01-16 LAB — MPO/PR-3 (ANCA) ANTIBODIES: Myeloperoxidase Abs: <9 U/mL (ref 0.0–9.0)

## 2017-01-16 LAB — C3 COMPLEMENT: C3 Complement: 65 mg/dL — ABNORMAL LOW (ref 82–167)

## 2017-01-16 MED ORDER — POTASSIUM CHLORIDE CRYS ER 20 MEQ PO TBCR
40.0000 meq | EXTENDED_RELEASE_TABLET | Freq: Once | ORAL | Status: AC
  Administered 2017-01-16: 40 meq via ORAL
  Filled 2017-01-16: qty 2

## 2017-01-16 MED ORDER — LACTULOSE 10 GM/15ML PO SOLN
20.0000 g | Freq: Once | ORAL | Status: AC
  Administered 2017-01-16: 20 g via ORAL
  Filled 2017-01-16: qty 30

## 2017-01-16 MED ORDER — CEFAZOLIN SODIUM-DEXTROSE 1-4 GM/50ML-% IV SOLN
1.0000 g | Freq: Three times a day (TID) | INTRAVENOUS | Status: DC
  Administered 2017-01-17 – 2017-01-19 (×8): 1 g via INTRAVENOUS
  Filled 2017-01-16 (×10): qty 50

## 2017-01-16 NOTE — Progress Notes (Signed)
   Subjective: no acute events overnight, pt denies any shortness of breath, dizziness or light headedness, chest pain, nausea/vomiting or diarrhea.  She feels her leg is less tender today, she does not have any chills or fever this morning.    Objective:  Vital signs in last 24 hours: Vitals:   01/15/17 1958 01/15/17 2103 01/16/17 0200 01/16/17 0616  BP:  92/61  (!) 97/57  Pulse:  (!) 110  84  Resp:  18  16  Temp: (!) 102.8 F (39.3 C) (!) 102.8 F (39.3 C) 99.9 F (37.7 C) 98.2 F (36.8 C)  TempSrc: Oral Oral Oral Oral  SpO2:  96%  98%  Weight:      Height:       Physical Exam  Constitutional: She is oriented to person, place, and time. No distress.  Eyes: Right eye exhibits no discharge. Left eye exhibits no discharge. Scleral icterus is present.  Cardiovascular: Normal rate, regular rhythm and normal heart sounds.  Exam reveals no gallop and no friction rub.   No murmur heard. Pulmonary/Chest: Effort normal and breath sounds normal. No respiratory distress. She has no wheezes. She has no rales. She exhibits no tenderness.  Abdominal: Soft. Bowel sounds are normal. She exhibits no distension and no mass. There is no tenderness. There is no rebound and no guarding.  Musculoskeletal: She exhibits edema (bilateral LE edema worse on right).  Neurological: She is alert and oriented to person, place, and time.  Skin: She is not diaphoretic.     See HandP or media tab for images, pt has palpable purpura and petichial circumferential rash below the right knee down to the footl.  It is edematous, has improved from yesterday, tenderness to palpation improved today as well.      Assessment/Plan:  Active Problems:   Vasculitis ([place])   Hepatic cirrhosis ([facility])  Hepatic Cirrhosis: recent diagnosis, etiology yet to be determined.  Pt has a history of excessive alcohol use over the last several months, hepatitis panel is negative, no recent travel, ferriting upper limit of normal.     -Smooth muscle, anti-centromere, anti-mitochondrial, ANCA, PR3 to rule out autoimmune hepatitis, PBC, and PSC -MELD score 28 -pt has low albumin, low platelets, elevated PT, elevated bilirubin, low normal fibrinogen and elevated D dimer -contacted [facility] awaiting results for lab tests done there: ANA, ceruloplasmin, copper   Petechial rash with palpable purpura of R Lower extremity: possibly vasculitis, initially no systemic signs of infection, elevated ESR and CRP.  Pt developed fever 102.9 at 5pm 01/15/17 pt started on IV ancef 1g/8hrs.  Afebrile the morning after, improvement in appearance of leg.  -C3, C4, UA, ANCA, ANA, MPO, PR3, immunoglobulin levels  -C3 C4 came back mildly low, difficult to interpret in setting of decompensated cirrhosis -biopsy results reveal Leukocytoclastic Vasculitis discussed with dermatologist they feel this may have been precipitated by cellulitis.   -Continue IV ancef   Macrocytic Anemia: could be secondary to alcohol abuse or cirrhosis  -pt asymptomatic -Hgb stable at 8.9 this am   Electrolyte Derangements  - Hypokalemia: holding lasix and replacing as needed.    Dispo: Anticipated discharge in approximately 2 day(s).   Katherine Roan, MD 01/16/2017, 11:09 AM Vickki Muff MD PGY-1 Internal Medicine Pager # [phone]

## 2017-01-16 NOTE — Consult Note (Signed)
[facility] Consult: 11:02 AM 01/16/2017  LOS: 2 days    Attending physician's note   I have taken a history, examined the patient and reviewed the chart. I agree with the Advanced Practitioner's note, impression and recommendations. 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 Per Dr. Beryle Beams cellulitis is improving, continue Cefazolin . If she has no significant improvement over next few days consider switching to IV vancomycin for possible MRSA. Decompensated cirrhosis with ascites, MELD 22 Acute alcoholic hepatitis. Discriminate function >32, though ?patient says her last drink of alcohol was greater than a month ago Will hold off starting prednisolone or pentoxifylline given possible cellulitis to prevent it from getting worse Volume overload with ascites and lower extremity edema: start low-dose diuretics once infection is improving Can plan for EGD as outpatient once acute issues resolved for esophageal varices screening RUQ Ultrasound 01/06/2017 showed nodular liver with no lesions and ascites. F/u AFP for [place] screening.  Request IR for diagnostic paracentesis to exclude SBP   K Denzil Magnuson, MD [phone] Mon-Fri 8a-5p [phone] after 5p, weekends, holidays    Referring Provider: Dr Beryle Beams and Dr Shan Levans of teaching service.   Primary Care Physician:  Shelda Pal, DO in [place] at [facility].   Primary Gastroenterologist:  unassigned    Reason for Consultation:  New diagnosis of  cirrhosis.   HPI: Elizabeth Norton is a 41 y.o. female.  PMH obesity. Fibromyalgia, depression.   2011 elevated LFTs, fatty liver on ultrasound.  Iron deficiency anemia a few years ago but no longer requires iron supplementation.      At [facility] a 01/06/17 abd ultrasound for elevated LFTs showed cirrhosis, patent portal vein, no ductal dilatation, ascites and right pleural effusion.  Hepatitis ABC, copper level, ceruloplasmin, and ANA reportedly negative.  Besides hx of fatty liver, consumption of ETOH is a factor: up to 1 bottle wine daily.     Swelling in LE for about 1 month.  Treated with Lasix 80 mg, potassium.  Swelling persisted. This past Monday, she developed redness and eventually tenderness in the right leg which was more swollen than the left leg. Her primary care provider told her to go to the emergency room and she ended up at [facility] for a 2 day admission 10/7 - 10/9 for right LE cellulitis, ? vasculitis.  Treated with Vanc and Aztreonam.  No DVT on dopplers.  Pt signed out AMA as she refused suggested transfer to [facility] for vascular and rheumatology expertise  Came to [facility] ED after leaving [facility].      Macrocytic anemia, Hgb 11.3 >> 9.8 >> 8.6 >> 8.8, MCV 108 >> 100.  Platelets 67 >>73.   t bili 6.4 >> 5.1.  AST/ALT 89.32 >> 69/24.  Normal alk phos.   Na 124 >> 129.   Pt/INR 25/2.2.  Pending labs:Smooth muscle, anti-centromere, anti-mitochondrial, ANCA, PR3 to rule out autoimmune hepatitis, PBC, and PSC Also several pending labs for r/o of vasculitis.    So far as the cirrhosis goes, she has not drunk any alcohol for about 6 weeks. Prior to that she could drink up to one bottle of wine per day. 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 She seemed never injected or snorted illicit drugs. No previous transfusions of blood productsHer appetite is good. She hasn't seen  any dark or bloody stools. She's had lifelong issues with slow bowel transit manifesting as having a bowel movement about once a week.She occasionally uses Tylenol but not to excess. Less frequently she may use ibuprofen. No nausea, vomiting, anorexia. Within the past year she weighed 248# at her highest but started watching her weight diet and is down to 220#.  She has not had increased abdominal girth or abdominal pain.  Patient has never undergone colonoscopy or upper endoscopy or seen GI specialists.  Family history significant for her father who was an alcoholic but also had hepatitis C and died with cirrhosis in his 66s.      Past Medical History:  Diagnosis Date  . Cirrhosis of liver ([place])   . Depression   . Fibromyalgia   . High cholesterol     Past Surgical History:  Procedure Laterality Date  . right wrist fracture      Prior to Admission medications   Medication Sig Start Date End Date Taking? Authorizing Provider  furosemide (LASIX) 40 MG tablet Take 1 tablet (40 mg total) by mouth daily. Patient taking differently: Take 80 mg by mouth daily.  01/01/17  Yes Shelda Pal, DO  Potassium (POTASSIMIN PO) Take 1 tablet by mouth 2 (two) times daily.   Yes [provider]    Scheduled Meds:  Infusions: .  ceFAZolin (ANCEF) IV 1 g (01/16/17 0617)   PRN Meds:    Allergies as of 01/14/2017 - Review Complete 01/14/2017  Allergen Reaction Noted  . Penicillins Hives 12/26/2016  . Latex Itching 01/13/2017  . Penicillin g Rash 01/13/2017    Family History  Problem Relation Age of Onset  . Hyperthyroidism Mother   . Hyperthyroidism Sister   . Hyperthyroidism Brother     Social History   Social History  . Marital status: Married    Spouse name: N/A  . Number of children: N/A  . Years of education: N/A   Occupational History  . Not on file.   Social History Main Topics  . Smoking status: Never Smoker  . Smokeless tobacco: Never Used  .  Alcohol use Yes     Comment: occasional  . Drug use: No  . Sexual activity: No   Other Topics Concern  . Not on file   Social History Narrative  . No narrative on file    REVIEW OF SYSTEMS: Constitutional:  No extreme fatigue or weakness. ENT:  No nose bleeds Pulm:  No difficulty breathing, no cough. CV:  No palpitations, no chest pain.  LE edema.   GU:  No hematuria, no frequency GI:  Per HPI Heme:  No unusual or excessive bleeding or bruising.   Transfusions:  Never. Neuro:  No headaches, no peripheral tingling or numbness Derm:  No itching, no rash or sores.  Endocrine:  No sweats or chills.  No polyuria or dysuria Immunization:  Did not  inquire as to recent or previous vaccinations. Travel:  None beyond local counties in last few months.    PHYSICAL EXAM: Vital signs in last 24 hours: Vitals:   01/16/17 0200 01/16/17 0616  BP:  (!) 97/57  Pulse:  84  Resp:  16  Temp: 99.9 F (37.7 C) 98.2 F (36.8 C)  SpO2:  98%   Wt Readings from Last 3 Encounters:  01/14/17 98.1 kg (216 lb 4.3 oz)  12/26/16 108 kg (238 lb 2 oz)    General: obese, pleasant, comfortable woman Head:  No facial asymmetry or swelling.No signs of head trauma.  Eyes:  No scleral icterus. No conjunctival pallor. EOMI. Ears:  Not hard of hearing  Nose:  No congestion or discharge Mouth:  Oropharynx moist and clear. Tongue midline. Good dentition. Neck:  No JVD, masses, thyromegaly, bruits. Lungs:  No labored breathing or cough. Lungs clear with good breath sounds bilaterally. Heart: RRR. No MRG. S1, S2 present. Abdomen:  Soft. Not tender or distended. Unable to appreciate splenomegaly. Unable to palpate liver edge. No bruits, no hernias..   Rectal: deferred   Musc/Skeltl: no joint swelling, redness or gross deformities. Extremities:  Marketed petechial changes and redness along with swelling in the right leg from the foot up to the knee. 1 to 2 plus edema in the left foot.  Neurologic:  Alert.  No tremor or asterixis. No limb weakness. Oriented times 3. Answers questions intelligently with good detail. Skin:  Petechial rash of the right lower extremity as described above. Tattoos:  none Nodes:  No cervical adenopathy.   Psych:  ,, pleasant, cooperative.  Intake/Output from previous day: 10/10 0701 - 10/11 0700 In: 526.7 [I.V.:376.7; IV Piggyback:150] Out: -  Intake/Output this shift: No intake/output data recorded.  LAB RESULTS:  Recent Labs  01/14/17 1552 01/15/17 0317 01/16/17 0631  WBC 9.4 6.9 8.1  HGB 11.0* 8.6* 8.9*  HCT 30.2* 24.2* 25.4*  PLT 83* 67* 73*   BMET Lab Results  Component Value Date   NA 129 (L) 01/16/2017   NA 128 (L) 01/15/2017   NA 126 (L) 01/14/2017   K 3.4 (L) 01/16/2017   K 2.8 (L) 01/15/2017   K 2.7 (LL) 01/14/2017   CL 93 (L) 01/16/2017   CL 89 (L) 01/15/2017   CL 85 (L) 01/14/2017   CO2 28 01/16/2017   CO2 30 01/15/2017   CO2 27 01/14/2017   GLUCOSE 111 (H) 01/16/2017   GLUCOSE 107 (H) 01/15/2017   GLUCOSE 98 01/14/2017   BUN 11 01/16/2017   BUN 11 01/15/2017   BUN 10 01/14/2017   CREATININE 0.91 01/16/2017   CREATININE 0.76 01/15/2017   CREATININE 0.74 01/14/2017   CALCIUM 8.3 (L) 01/16/2017   CALCIUM 7.8 (L) 01/15/2017   CALCIUM 8.4 (L) 01/14/2017   LFT  Recent Labs  01/14/17 1552 01/15/17 0317  PROT 7.0 5.5*  ALBUMIN 2.6* 2.0*  AST 99* 71*  ALT 34 25  ALKPHOS 83 62  BILITOT 6.3* 5.4*   PT/INR Lab Results  Component Value Date   INR 2.39 01/15/2017   INR 2.15 01/14/2017   Hepatitis Panel No results for input(s): HEPBSAG, HCVAB, HEPAIGM, HEPBIGM in the last 72 hours. C-Diff No components found for: CDIFF Lipase  No results found for: LIPASE  Drugs of Abuse  No results found for: LABOPIA, COCAINSCRNUR, LABBENZ, AMPHETMU, THCU, LABBARB   RADIOLOGY STUDIES: Dg Tibia/fibula Right  Result Date: 01/14/2017 CLINICAL DATA:  Right leg cellulitis. EXAM: RIGHT TIBIA AND FIBULA -  2 VIEW COMPARISON:  None  FINDINGS: There is no acute fracture or subluxation. Mild degenerative changes identified within the knee. No acute bone abnormality identified. No focal bone erosions. IMPRESSION: 1. Diffuse soft tissue swelling. 2. No acute bone abnormality peer Electronically Signed   By: Kerby Moors M.D.   On: 01/14/2017 18:07     IMPRESSION:   *  New diagnosis of cirrhosis.  Decompensated with ascites, hyponatremia, coagulopathy, thrombocytopenia. Abnormal LFTs and fatty liver date back to 2011.  Etiology most likely is ETOH and fatty liver (due to ETOH or obesity) but autoimmune markers are pending.   May have component of alcoholic hepatitis. Last ETOH was early to mid 12/2016.    *  Macrocytic anemia.  *  Right LE cellulitis, ? Vasculitis.  No DVT.  On Ancef.     PLAN:     *  Awaiting results from autoimmune markers but ANA apparently negative (need to confirm this).  *  Dr. Silverio Decamp will be seeing the patient. ? inpatient upper endoscopy for variceal, portal gastropathy screening? ? If sufficient ascites, perform diagnostic paracentesis to rule out SBP?  However, nothing in her clinical history leads me to suspect SBP.   Will get AFP level in AM.     Azucena Freed  01/16/2017, 11:02 AM Pager: [phone]

## 2017-01-16 NOTE — Progress Notes (Signed)
Medicine attending: I examined this patient today together with resident physician Dr. Thornell Mule and I concur with his evaluation and management plan which we discussed together. She became febrile to 102.9 yesterday evening.  Antibiotic started.  Given exam of her right lower extremity, cannot exclude the fact that underlying cellulitis may be partially responsible for her vasculitic rash. 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  We will hold off on steroids for now pending results of the outstanding immune tests that would support such a diagnosis.  Continue antibiotics for now. Complexity of the situation discussed with the patient.  She may has a number with pre-existing conditions that predispose toward her current liver failure.  She has no systemic symptoms that would support a generalized vasculitis.  Complement levels are low but this is nonspecific.  Renal function is normal and no active sediment. We will get gastroenterology involved at this time.  She is clinically stable and there is no immediate need to begin steroids just based on the skin biopsy findings. Given her young age and extent of liver damage, [facility] and [facility] on a transplant list.

## 2017-01-16 NOTE — Progress Notes (Signed)
MD paged about pt's temp of 102.8 @ 2147. Waiting on response. Cold compresses applied and blankets removed in the meantime. Will ctm.

## 2017-01-16 NOTE — Care Management Note (Signed)
Case Management Note  Patient Details  Name: Elizabeth Norton MRN: [id number] Date of Birth: [birth year]  Subjective/Objective:                Admitted with vasculitis, hx of recent cirrhosis.  From home with husband. Independent with ADL's , no DME  Usage PTA. Pt without insurance, however , states has patient assistance program setup with PCP and PTA has been able to afford prescribed medications.  PCP: Kara Pacer A.  Action/Plan: Plan is to d/c to home when medically stable. CM will continue to follow for d/c needs.  Expected Discharge Date:                  Expected Discharge Plan:  Home/Self Care  In-House Referral:     Discharge planning Services  CM Consult  Status of Service:  In process, will continue to follow  If discussed at Long Length of Stay Meetings, dates discussed:    Additional Comments:  Epifanio Lesches, RN 01/16/2017, 11:22 AM

## 2017-01-17 ENCOUNTER — Inpatient Hospital Stay (HOSPITAL_COMMUNITY): Payer: Self-pay

## 2017-01-17 LAB — CBC
HEMATOCRIT: 24.4 % — AB (ref 36.0–46.0)
Hemoglobin: 8.8 g/dL — ABNORMAL LOW (ref 12.0–15.0)
MCH: 36.4 pg — ABNORMAL HIGH (ref 26.0–34.0)
MCHC: 36.1 g/dL — ABNORMAL HIGH (ref 30.0–36.0)
MCV: 100.8 fL — AB (ref 78.0–100.0)
Platelets: 88 10*3/uL — ABNORMAL LOW (ref 150–400)
RBC: 2.42 MIL/uL — AB (ref 3.87–5.11)
RDW: 14.8 % (ref 11.5–15.5)
WBC: 9.9 10*3/uL (ref 4.0–10.5)

## 2017-01-17 LAB — BASIC METABOLIC PANEL
ANION GAP: 7 (ref 5–15)
BUN: 12 mg/dL (ref 6–20)
CHLORIDE: 96 mmol/L — AB (ref 101–111)
CO2: 26 mmol/L (ref 22–32)
CREATININE: 0.81 mg/dL (ref 0.44–1.00)
Calcium: 8.4 mg/dL — ABNORMAL LOW (ref 8.9–10.3)
GFR calc non Af Amer: >60 mL/min (ref >60)
GLUCOSE: 97 mg/dL (ref 65–99)
Potassium: 3.4 mmol/L — ABNORMAL LOW (ref 3.5–5.1)
Sodium: 129 mmol/L — ABNORMAL LOW (ref 135–145)

## 2017-01-17 LAB — IMMUNOFIXATION ELECTROPHORESIS
IGA: 709 mg/dL — AB (ref 87–352)
IgG (Immunoglobin G), Serum: 1446 mg/dL (ref 700–1600)
IgM (Immunoglobulin M), Srm: 372 mg/dL — ABNORMAL HIGH (ref 26–217)
TOTAL PROTEIN ELP: 5.7 g/dL — AB (ref 6.0–8.5)

## 2017-01-17 LAB — ANCA TITERS: P-ANCA: <1:20 {titer}

## 2017-01-17 LAB — VITAMIN B12: Vitamin B-12: 1265 pg/mL — ABNORMAL HIGH (ref 180–914)

## 2017-01-17 MED ORDER — IOPAMIDOL (ISOVUE-300) INJECTION 61%
INTRAVENOUS | Status: AC
  Administered 2017-01-17: 13:00:00
  Filled 2017-01-17: qty 100

## 2017-01-17 MED ORDER — POTASSIUM CHLORIDE CRYS ER 20 MEQ PO TBCR
40.0000 meq | EXTENDED_RELEASE_TABLET | Freq: Once | ORAL | Status: AC
  Administered 2017-01-17: 40 meq via ORAL
  Filled 2017-01-17: qty 2

## 2017-01-17 MED ORDER — IOPAMIDOL (ISOVUE-300) INJECTION 61%
100.0000 mL | Freq: Once | INTRAVENOUS | Status: AC | PRN
  Administered 2017-01-17: 100 mL via INTRAVENOUS

## 2017-01-17 MED ORDER — IOPAMIDOL (ISOVUE-300) INJECTION 61%
INTRAVENOUS | Status: AC
  Administered 2017-01-17: 11:00:00
  Filled 2017-01-17: qty 30

## 2017-01-17 MED ORDER — PHYTONADIONE 5 MG PO TABS
10.0000 mg | ORAL_TABLET | Freq: Every day | ORAL | Status: AC
  Administered 2017-01-17 – 2017-01-19 (×3): 10 mg via ORAL
  Filled 2017-01-17 (×3): qty 2

## 2017-01-17 NOTE — Progress Notes (Signed)
Subjective: no acute events overnight, pt denies any shortness of breath, dizziness or light headedness, chest pain, nausea/vomiting or diarrhea. She developed a slight fever yesterday evening that was transient.  She is afebrile this morning and has no chills or discomfort.  She still has some pain in her leg but reports less than when she arrived.  Objective:  Vital signs in last 24 hours: Vitals:   01/16/17 1511 01/16/17 2147 01/17/17 0136 01/17/17 0613  BP: 123/80 (!) 98/56  115/63  Pulse: 92 (!) 110  (!) 101  Resp: '18 20  18  ' Temp: 98.4 F (36.9 C) (!) 102.8 F (39.3 C) 99.6 F (37.6 C) 98.7 F (37.1 C)  TempSrc: Oral Oral Oral Oral  SpO2: 99% 96%  98%  Weight:      Height:       Physical Exam  Constitutional: She is oriented to person, place, and time. No distress.  Eyes: Right eye exhibits no discharge. Left eye exhibits no discharge. Scleral icterus is present.  Cardiovascular: Normal rate and regular rhythm.  Exam reveals no gallop and no friction rub.   Murmur (2/6 systolic LLSB) heard. Pulmonary/Chest: Effort normal and breath sounds normal. No respiratory distress. She has no wheezes. She has no rales. She exhibits no tenderness.  Abdominal: Soft. Bowel sounds are normal. She exhibits no distension and no mass. There is no tenderness. There is no rebound and no guarding.  Musculoskeletal: She exhibits edema (bilateral LE edema worse on right).  Neurological: She is alert and oriented to person, place, and time.  Skin: She is not diaphoretic.     See HandP or media tab for images, pt has palpable purpura and petichial circumferential rash below the right knee down to the footl.  It is edematous, continually improving in appearance.  Less painful to palpation, still very warm.      Assessment/Plan:  Active Problems:   Vasculitis ([place])   Hepatic cirrhosis ([facility])  Hepatic Cirrhosis: recent diagnosis, etiology yet to be determined.  Pt has a history of excessive  alcohol use over the last several months, hepatitis panel is negative, no recent travel, ferriting upper limit of normal.  MPO/PR3 neg, anti smooth muscle antibody weak positive   -Smooth muscle, anti-centromere, anti-mitochondrial, ANCA, PR3 to rule out autoimmune hepatitis, PBC, and PSC -MELD score 28 -pt has low albumin, low platelets, elevated PT, elevated bilirubin, low normal fibrinogen and elevated D dimer -contacted [facility] awaiting results for lab tests done there: ANA, ceruloplasmin, copper --CT scan with contrast today to further evaluate liver -GI consulted, appreciate recommendations, pt was started on vit k.  Will slowly institute ascites therapy as pt is getting contrast today with CT scan.  -recheck coags in around 48hours   Petechial rash with palpable purpura of R Lower extremity: possibly vasculitis, initially no systemic signs of infection, elevated ESR and CRP.  Pt developed fever 102.9 at 5pm 01/15/17 pt started on IV ancef 1g/8hrs.    -C3, C4, UA, ANCA, ANA, MPO, PR3, immunoglobulin levels  -C3 C4 came back mildly low, difficult to interpret in setting of decompensated cirrhosis -MPO/PR3 neg -anti smooth muscle antibody weak positive -biopsy results reveal Leukocytoclastic Vasculitis discussed with dermatologist they feel this may have been precipitated by cellulitis.   -Continue IV ancef    Macrocytic Anemia: could be secondary to alcohol abuse or cirrhosis  -pt asymptomatic -Hgb stable at 8.8 today   Electrolyte Derangements  - Hypokalemia: holding lasix and replacing as needed.  Dispo: Anticipated discharge in approximately 2 day(s).   Elizabeth Roan, MD 01/17/2017, 11:39 AM Elizabeth Muff MD PGY-1 Internal Medicine Pager # [phone]

## 2017-01-17 NOTE — Progress Notes (Signed)
Medicine attending: We appreciate GI consultation.  Patient's alcohol intake is felt to be a significant factor working here with respect to her liver disease.  Pattern of liver function test not suggesting immune hepatitis.  Consultant in agreement with waiting on consideration for steroids until better documentation that there is an immune component to her liver disease which is seeming less likely at this time. I examined this patient today together with resident physician Dr. Thornell Mule and I concur with his evaluation and management plan which we discussed together. Hectic temperature curve day #2 cefazolin for cellulitis right lower extremity with vasculitic changes.  Leg itself slowly improving with decreasing erythema and pain.  White count up compared with the last 2 days. General exam otherwise unchanged. Partial results back from vasculitis profile and antibodies to myeloperoxidase and ANCA proteinase 3 not detectable.   Impression: 1.  Newly discovered advanced liver failure with components of underlying fatty liver and alcohol use.  Rule out autoimmune disease which seems less likely at this time. I would like to hold off on paracentesis.  She is already on antibiotics.  She does not have clinical ascites.  No recent GI bleed, malnutrition, or other risk factors for SBP.  We will get a CT scan today for better characterization of her liver, spleen, and whether or not she has varices. 2.  Cellulitis right leg with vasculitic component.  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 Plan to continue cefazolin for at least a total of 72 hours.  If persistent fever then changed to vancomycin.

## 2017-01-17 NOTE — Progress Notes (Signed)
Daily Rounding Note  01/17/2017, 11:40 AM  LOS: 3 days   SUBJECTIVE:   Chief complaint:     Feeling well.   No complaints.   OBJECTIVE:         Vital signs in last 24 hours:    Temp:  [98.4 F (36.9 C)-102.8 F (39.3 C)] 98.7 F (37.1 C) (10/12 0613) Pulse Rate:  [92-110] 101 (10/12 0613) Resp:  [18-20] 18 (10/12 0613) BP: (98-123)/(56-80) 115/63 (10/12 0613) SpO2:  [96 %-100 %] 98 % (10/12 0613) Last BM Date: 01/16/17 Filed Weights   01/14/17 1541 01/14/17 2106  Weight: 99.8 kg (220 lb) 98.1 kg (216 lb 4.3 oz)   General: looks well   Heart: RRR Chest: clear bil.  No dyspnea Abdomen: soft, obese, NT.  No obvious ascites  Extremities: right LE cellulitis, swelling.  Lesser edema in left foot Neuro/Psych:  Oriented x  3.  No deficits.  Good historian.  Good spirits  Intake/Output from previous day: 10/11 0701 - 10/12 0700 In: 600 [P.O.:600] Out: -   Intake/Output this shift: No intake/output data recorded.  Lab Results:  Recent Labs  01/15/17 0317 01/16/17 0631 01/17/17 0359  WBC 6.9 8.1 9.9  HGB 8.6* 8.9* 8.8*  HCT 24.2* 25.4* 24.4*  PLT 67* 73* 88*   BMET  Recent Labs  01/15/17 0317 01/16/17 0631 01/17/17 0359  NA 128* 129* 129*  Elizabeth 2.8* 3.4* 3.4*  CL 89* 93* 96*  CO2 GLUCOSE 107* 111* 97  BUN CREATININE 0.76 0.91 0.81  CALCIUM 7.8* 8.3* 8.4*   LFT  Recent Labs  01/14/17 1552 01/15/17 0317  PROT 7.0 5.5*  ALBUMIN 2.6* 2.0*  AST 99* 71*  ALT 34 25  ALKPHOS 83 62  BILITOT 6.3* 5.4*   PT/INR  Recent Labs  01/14/17 1726 01/15/17 0317  LABPROT 23.9* 25.9*  INR 2.15 2.39   Hepatitis Panel No results for input(s): HEPBSAG, HCVAB, HEPAIGM, HEPBIGM in the last 72 hours.  Studies/Results: Dg Chest 2 View  Result Date: 01/16/2017 CLINICAL DATA:  Hepatic cirrhosis. EXAM: CHEST  2 VIEW COMPARISON:  None. FINDINGS: The heart size and mediastinal contours  are within normal limits. No pneumothorax is noted. Minimal bilateral pleural effusions are noted. No consolidative process is noted. The visualized skeletal structures are unremarkable. IMPRESSION: Minimal bilateral pleural effusions. No other abnormality seen in the chest. Electronically Signed   By: Lupita Raider, M.D.   On: 01/16/2017 13:16   Scheduled Meds: . potassium chloride  40 mEq Oral Once   Continuous Infusions: .  ceFAZolin (ANCEF) IV Stopped (01/17/17 0203)   PRN Meds:. ASSESMENT:   *  New diagnosis of cirrhosis.  Decompensated with ascites, hyponatremia, coagulopathy, thrombocytopenia. Abnormal LFTs and fatty liver date back to 2011.  Etiology most likely is ETOH and fatty liver (due to ETOH or obesity) but autoimmune markers are pending.   May have component of alcoholic hepatitis. Last ETOH was early to mid 12/2016.    *  Macrocytic anemia.  *  Right LE cellulitis, ? Vasculitis.  No DVT.  On Ancef.   *  Coagulopathy.    *  Hyponatremia.     PLAN   *  Cancel ultrasound as just had one on 10/1.  If ascites present on CT today, should undergo diagnostic para centesis with cell count, differential, gram stain, cultures  To r/o SBP though I suspect she does  not have this, just need to rule it out.    *  For mgt of ascites, meds recommended are aldactone/lasix.  Good starting dose is 50/20 mg or 100/40 if higher dose needed.  Follow BMET (Na and renal function) while on diuretics.    *  Dr Loreta Ave covering service on W/E and will not round on pt .  We can see her on 10/15 if she has not been discharged.    *  Oral vitamin Elizabeth x 3 days, see if cogs improve.       Elizabeth Norton  01/17/2017, 11:40 AM Pager: [phone]   Attending physician's note   I have taken an interval history, reviewed the chart and examined the patient. I agree with the Advanced Practitioner's note, impression and recommendations.  RLE improving with decreased redness and edema F/u CT abd &  pelvis, if evidence of significant ascites, request IR for diagnostic paracentesis Please call with any questions  Elizabeth Scherry Ran, MD [phone] Mon-Fri 8a-5p [phone] after 5p, weekends, holidays

## 2017-01-17 NOTE — Discharge Summary (Addendum)
Name: Elizabeth Norton MRN: [id number] DOB: [birth year] 41 y.o. PCP: Rocky Morel, MD  Date of Admission: 01/14/2017  4:13 PM Date of Discharge: 01/20/2017 Attending Physician: No att. providers found  Discharge Diagnosis: 1.  Cellulitis Vasculitis Hepatic Cirrhosis Active Problems:   Vasculitis ([place])   Hepatic cirrhosis ([place])   Discharge Medications: Allergies as of 01/19/2017      Reactions   Penicillins Hives   Had as a child Has patient had a PCN reaction causing immediate rash, facial/tongue/throat swelling, SOB or lightheadedness with hypotension: No Has patient had a PCN reaction causing severe rash involving mucus membranes or skin necrosis: No Has patient had a PCN reaction that required hospitalization: No Has patient had a PCN reaction occurring within the last 10 years: No If all of the above answers are "NO", then may proceed with Cephalosporin use.   Latex Itching   Penicillin G Rash      Medication List    STOP taking these medications   POTASSIMIN PO     TAKE these medications   cephALEXin 500 MG capsule Commonly known as:  KEFLEX Take 2 capsules (1,000 mg total) by mouth 2 (two) times daily.   furosemide 20 MG tablet Commonly known as:  LASIX Take 1 tablet (20 mg total) by mouth daily. What changed:  medication strength  how much to take   spironolactone 25 MG tablet Commonly known as:  ALDACTONE Take 2 tablets (50 mg total) by mouth daily.       Disposition and follow-up:   [name] was discharged from [facility] in Stable condition.  At the hospital follow up visit please address:  1.    RLE Cellulitis/Vasculits -continue to monitor RLE for continued improvement -continue oral antibiotics -can repeat ESR/CRP -If all signs of infection dissipated and vasculitis persists can consider short course of steroids  Hepatic Cirrhosis -pt needs close follow up with [facility] -continue ascites  management titrate appropriate dosage of aldactone/lasix pt discharged on 57m/20mg -monitor liver function -pt is a good candidate for liver transplant, should be [facility] a [facility]   2.  Labs / imaging needed at time of follow-up: CBC, CMP, PT, PTT  3.  Pending labs/ test needing follow-up: blood culture neg x4 days  Follow-up Appointments: Follow-up Information    Sanchez-Brugal, FMyer Peer MD Follow up in 3 day(s).   Specialty:  Internal Medicine Why:  Please follow up with your primary care doctor in the next few days Contact information: [phone]        [facility] Follow up in 1 week(s).   Specialty:  Gastroenterology Why:  Please follow up with [place] in the next week for further recommendations on how to manage your liver disease.  Mention that you were seen by them in the hospital.   Contact information: [phone] HospitalCourse by problem list: Active Problems:   Vasculitis ([place]   Hepatic cirrhosis ([place]   1.  Hepatic Cirrhosis 41year old woman in previous excellent health who was evaluated as an outpatient 2 months ago for abnormal liver tests and bilateral peripheral edema.  Ultrasound on September 26 reported as showing a nodular liver and presence of ascites consistent with cirrhosis.  Patent portal vein.  Oddly, no mention is made about her spleen.  Evaluation to date including hepatitis profile, A, B,  C, copper level, ceruloplasmin, and ANA reportedly negative.  She denies any prior transfusion.  She denies any IV drug use.  She has used alcohol intermittently but at times heavy in the past with up to 1 bottle of wine daily.  Abdominal ultrasound done in our system in May 2011 noted diffuse fatty infiltration of the liver.  No focal mass.  No splenomegaly at that time.  We began to workup for autoimmune  causes of the patient's liver disease.  They're listed below and more or less came back inconclusive but not very suspicious for an autoimmune etiology.  GI was consulted and agreed that the most likely explanation was fatty liver disease that was aggravated by patient's excess alcohol use.  When she arrived her meld score was 28.  We additionally ordered a CT abdomen and pelvis with contrast to further evaluate the liver and other organs namely the spleen. The results are listed below but pertinent features include some perihepatic fluid, some minimal esophageal varices, minimal abdominal ascites, no splenomegaly.  Patient had minimal abdominal ascites, no changes in her mental status,and was being treated for the right lower extremity cellulitis with IV antibiotics.  Our suspicion for spontaneous bacterial peritonitis was low therefore we deferred paracentesis.  [facility] began additional workup and agreed to follow the patient in the community.        RLE Cellulitis/Vasculits About 2 weeks ago she began to develop painful swelling of her right calf up to the knee.  She was admitted overnight to [facility].  Right leg swollen and tender with a vasculitic rash.  Venous Doppler with no obvious thrombosis.  Patient elected to come here for further evaluation.  Unclear why she was discharged on 80 mg of Lasix 3 times daily.  Biopsy of the rash was done.  She was given an empiric dose of vancomycin/aztreonam in the emergency department.  We admitted her to our service at that time the rash was presumed to be vasculitic.  She had no reports of fevers at home on arrival she was afebrile with a normal white count.  Antibiotics were discontinued, the biopsy results were sent to arrive the next morning.  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  However the patient began to spike a fever of 102.9 and had chills and felt ill.  We started IV Ancef every 8  hours with good result.  The following day he patient was afebrile, she was feeling much better.  The appearance of the leg was much improved from the day prior.  It was still very warm and edematous but the erythema was regressing.  We continued antibiotics ordered a CT abdomen and pelvis with contrast to further evaluate the liver, and spleen along with gathering further information about the level of abdominal ascites and varices.    The appearance of her leg continued to improve, she received 4 days total of IV antibiotics.  During the last 2 days of her stay we began to treat her ascites with Aldactone/Lasix 76m/20mg.  Her lower extremity edema began to improve especially in the uninfected leg.  Her potassium remained stable.  He was discharged on 5 more days of by mouth antibiotics keflex 1000 mg twice per day.  She was given instructions to follow-up with her primary care provider for further ascites management.     Discharge Vitals:   BP 116/63 (BP Location: Left Arm)   Pulse 94   Temp 98.8  F (37.1 C)   Resp 18   Ht '5\' 6"'  (1.676 m)   Wt 216 lb 4.3 oz (98.1 kg)   LMP 12/12/2016 (Exact Date)   SpO2 97%   BMI 34.91 kg/m   Pertinent Labs, Studies, and Procedures:    CBC Latest Ref Rng & Units 01/18/2017 01/17/2017 01/16/2017  WBC 4.0 - 10.5 K/uL 8.9 9.9 8.1  Hemoglobin 12.0 - 15.0 g/dL 9.0(L) 8.8(L) 8.9(L)  Hematocrit 36.0 - 46.0 % 25.2(L) 24.4(L) 25.4(L)  Platelets 150 - 400 K/uL 93(L) 88(L) 73(L)    BMP Latest Ref Rng & Units 01/19/2017 01/18/2017 01/17/2017  Glucose 65 - 99 mg/dL 87 85 97  BUN 6 - 20 mg/dL '10 10 12  ' Creatinine 0.44 - 1.00 mg/dL 0.69 0.66 0.81  Sodium 135 - 145 mmol/L 129(L) 129(L) 129(L)  Potassium 3.5 - 5.1 mmol/L 3.7 3.4(L) 3.4(L)  Chloride 101 - 111 mmol/L 97(L) 96(L) 96(L)  CO2 22 - 32 mmol/L '26 25 26  ' Calcium 8.9 - 10.3 mg/dL 8.2(L) 8.3(L) 8.4(L)    Protime-INR  Order: [id number]  Status:  Final result   Visible to patient:  Yes (MyChart) Next  appt:  01/23/2017 at 04:00 PM in Family Medicine [facility] [place], [place])      Ref Range & Units 1d ago  5d ago  6d ago    Prothrombin Time 11.4 - 15.2 seconds 22.7   25.9   23.9     INR  2.03  2.39  2.15         APTT  Order: [id number]  Status:  Final result   Visible to patient:  Yes (MyChart) Next appt:  01/23/2017 at 04:00 PM in Family Medicine ([facility] Oyster [place], DO)      Ref Range & Units 1d ago  6d ago    aPTT 24 - 36 seconds 50   50CM    Comment:        IF BASELINE aPTT IS ELEVATED,  SUGGEST PATIENT RISK ASSESSMENT  BE USED TO DETERMINE APPROPRIATE  ANTICOAGULANT THERAPY.        Prealbumin  Order: [id number]  Status:  Final result   Visible to patient:  Yes (MyChart) Next appt:  01/23/2017 at 04:00 PM in Family Medicine [facility] [place], [place])    Ref Range & Units 2d ago   Prealbumin 18 - 38 mg/dL <5    Comment: REPEATED TO VERIFY  Resulting Agency  [facility]       Anti-smooth muscle antibody, IgG  Order: [id number]  Status:  Final result   Visible to patient:  Yes (MyChart) Next appt:  01/23/2017 at 04:00 PM in Family Medicine [facility] Wendling, DO)    Ref Range & Units 6d ago   F-Actin IgG 0 - 19 Units 26    Comment: (NOTE)                 Negative                     0 - 19                  Weak positive               20 - 30                  Moderate to strong positive     >30  Actin Antibodies are found in 52-85% of patients with  autoimmune hepatitis or chronic active hepatitis  and  in 22% of patients with primary biliary cirrhosis.  Performed At: [facility]  [place], [place] [id number]  Lindon Romp MD [id number]         C3 complement  Order: [id number]  Status:  Final result   Visible to patient:  Yes (MyChart) Next appt:  01/23/2017 at 04:00 PM in Family Medicine [facility] [place], [place])    Ref Range & Units 6d ago   C3 Complement 82 - 167 mg/dL 65    Comment: (NOTE)  Performed  At: [facility]  [place], [place] [id number]  Lindon Romp MD DJ:[id number]        Mpo/pr-3 (anca) antibodies  Order: [id number]  Status:  Edited Result - FINAL   Visible to patient:  Yes (MyChart) Next appt:  01/23/2017 at 04:00 PM in Family Medicine [facility] Wendling, DO)    Ref Range & Units 6d ago   Myeloperoxidase Abs 0.0 - 9.0 U/mL <9.0   ANCA Proteinase 3 0.0 - 3.5 U/mL <3.5VC   Comment: (NOTE)  Performed At: [facility]  [place], [place] [id number]  Lindon Romp MD [id number]         ANCA Titers  Order: [id number]  Status:  Final result   Visible to patient:  Yes (MyChart) Next appt:  01/23/2017 at 04:00 PM in Family Medicine [facility] Wendling, DO)    Ref Range & Units 6d ago   C-ANCA Neg:<1:20 titer <1:20   P-ANCA Neg:<1:20 titer <1:20   Comment: (NOTE)  The presence of positive fluorescence exhibiting P-ANCA or C-ANCA  patterns alone is not specific for the diagnosis of Wegener's  Granulomatosis (WG) or microscopic polyangiitis. Decisions about  treatment should not be based solely on ANCA IFA results.  The  International ANCA Group Consensus recommends follow up testing of  positive sera with both PR-3 and MPO-ANCA enzyme immunoassays. As  many as 5% serum samples are positive only by EIA.  Ref. AM J Clin Pathol 1999;111:507-513.    Atypical P-ANCA titer Neg:<1:20 titer <1:20   Comment: (NOTE)  The atypical pANCA pattern has been observed in a significant  percentage of patients with ulcerative colitis, primary sclerosing  cholangitis and autoimmune hepatitis.  Performed At: [facility]  [address] [place], [place] [id number]  Lindon Romp MD EX:[id number]         HIV antibody (Routine Testing)  Order: [id number]  Status:  Final result   Visible to patient:  Yes (MyChart) Next appt:  01/23/2017 at 04:00 PM in Family Medicine ([place], [place])    Ref Range  & Units 6d ago   HIV Screen 4th Generation wRfx Non Reactive Non Reactive   Comment: (NOTE)  Performed At: [facility]  [place], [place] [id number]  Lindon Romp MD TS:[id number]    Resulting Agency         Immunofixation electrophoresis  Order: [id number]  Status:  Edited Result - FINAL   Visible to patient:  Yes (MyChart) Next appt:  01/23/2017 at 04:00 PM in Family Medicine ([facility] [place], [place])    Ref Range & Units 6d ago   Total Protein ELP 6.0 - 8.5 g/dL 5.7    IgG (Immunoglobin G), Serum 700 - 1,600 mg/dL 1,446   IgA 87 - 352 mg/dL 709    IgM (Immunoglobulin M), Srm 26 - 217 mg/dL 372    Comment: (NOTE)  Performed At:  [facility]  [place], [place] [id number]  Lindon Romp MD NU:[id number]    Immunofixation Result, Serum  CommentVC   Comment: (NOTE)        Ref Range & Units6d ago D-Dimer, Quant0.00 - 0.50 ug/mL-FEU    9.72   Comment: (NOTE)  At the manufacturer cut-off of 0.50 ug/mL FEU, this assay has been  documented to exclude PE with a sensitivity and negative predictive  value of 97 to 99%.  At this time, this assay has not been approved  by the FDA to exclude DVT/VTE.  Results should be correlated with clinical presentation.  Resulting Agency[facility]  Fibrinogen  Order: [id number]  Status:  Final result   Visible to patient:  Yes (MyChart) Next appt:  01/23/2017 at 04:00 PM in Family Medicine [facility] [place], [place])    Ref Range & Units 6d ago   Fibrinogen 210 - 475 mg/dL 198    Resulting Agency  [facility]    Specimen Collected: 01/14/17 20:44 Last Resulted: 01/14/17 21:26           Brain natriuretic peptide  Order: [id number]  Status:  Final result   Visible to patient:  Yes (MyChart) Next appt:  01/23/2017 at 04:00 PM in Family Medicine [facility] [place], [place])    Ref Range & Units 6d ago   B Natriuretic Peptide 0.0 - 100.0 pg/mL 264.2          C-reactive protein    Order: [id number]  Status:  Final result   Visible to patient:  Yes (MyChart) Next appt:  01/23/2017 at 04:00 PM in Family Medicine [facility] [place], [place])    Ref Range & Units 6d ago   CRP <1.0 mg/dL 5.9    Resulting Agency  [facility]    Specimen Collected: 01/14/17 17:26 Last Resulted: 01/14/17 18:58             Sedimentation rate  Order: [id number]  Status:  Final result   Visible to patient:  Yes (MyChart) Next appt:  01/23/2017 at 04:00 PM in Family Medicine ([facility] Oyster Wendling, DO)    Ref Range & Units 6d ago   Sed Rate 0 - 22 mm/hr 65    Resulting Agency  [facility]    Specimen Collected: 01/14/17 17:26 Last Resulted: 01/14/17 18:55            Discharge Instructions: Discharge Instructions    Call MD for:    Complete by:  As directed    Call MD for:  difficulty breathing, headache or visual disturbances    Complete by:  As directed    Call MD for:  extreme fatigue    Complete by:  As directed    Call MD for:  hives    Complete by:  As directed    Call MD for:  persistant dizziness or light-headedness    Complete by:  As directed    Call MD for:  persistant nausea and vomiting    Complete by:  As directed    Call MD for:  redness, tenderness, or signs of infection (pain, swelling, redness, odor or green/yellow discharge around incision site)    Complete by:  As directed    Call MD for:  severe uncontrolled pain    Complete by:  As directed    Call MD for:  temperature >100.4    Complete by:  As directed    Diet - low sodium heart healthy    Complete by:  As directed  Discharge instructions    Complete by:  As directed    Ms. Nicolls it was a pleasure to meet you.  It will be very important to follow up with your primary care doctor at [place] center and your GI doctor here at [facility] in [place].  Working together they can help manage your liver disease and keep track of the infection/vasculitis in your right leg.  It will be very  important for you to keep an eye on your leg.  If you notice it worsening in appearance, becoming more painful, or if you develop fever, chills or begin to feel ill you will need to call your doctor and or return to the emergency room if he can not see you that day.  We have started you on medications to help with the swelling in your legs.  These medicines may affect your electrolyte balance so it's important to follow up with your primary care doctor soon so he can check them.  He can also help keep an eye on your leg and see if it is improving.   Increase activity slowly    Complete by:  As directed       Signed: Katherine Roan, MD 01/20/2017, 12:11 PM    Medicine attending discharge note: I attest to the accuracy of the discharge evaluation and plan as recorded above by resident physician Dr. Vickki Muff.  41 year old woman in prior good health who initially presented to her primary care physicians with unexplained, severe, liver function abnormalities and bilateral lower extremity edema.  Abdominal ultrasound done in 2011 showed changes consistent with fatty liver at that time.  Ultrasound done September 26 showed a nodular contour of the liver and a small amount of perihepatic ascites.  Patent portal vein.  Spleen not commented on.  Hepatitis A, B, C, copper levels, ceruloplasmin, and ANA negative.  No prior transfusions.  No IV drug use.  Periodic heavy alcohol use up to 1 bottle of wine daily.  2 weeks prior to the admission she developed painful swelling of her right leg.  Admitted [facility] to [facility].  Right leg swollen and tender with a vasculitic rash.  Venous Doppler negative for thrombosis.  Patient elected to come here for further evaluation. She had no hepatosplenomegaly on exam.  No fluid wave on abdominal exam. Pertinent findings related to swollen, erythematous, tender, right leg from the knee down to the foot with superimposed palpable purpura.   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 She was initially afebrile but within 24 hours spiked a temperature over 102 degrees and was started on antibiotics. She had evidence of severe hepatic synthetic dysfunction with bilirubin 5, albumin 2.0, and prothrombin time 23.9 seconds lab normal up to 15.2.  A baseline alpha-fetoprotein level was normal A CT scan of the abdomen and pelvis confirmed ultrasound findings with a nodular hepatic contour compatible with cirrhosis.  Gallbladder was distended.  Spleen was not enlarged.  No varices noted.  A small amount of peri-hepatic ascites noted with fluid in the paracolic gutters and anatomic pelvis. HIV screen negative.  Urine negative for blood.  No active sediment.  No real signs to suggest a systemic vasculitis.  Additional laboratory showed decreased C3 and C4 complement.  No elevation of C-ANCA or P-ANCA antibodies or myeloperoxidase antibodies.  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  Anti-smooth muscle antibodies weakly positive at 26 units (0-19). She was seen in consultation  by gastroenterology.  They felt that it was likely that the combination of underlying fatty liver and alcohol use was responsible for her liver disease.  Very low likelihood that this was autoimmune liver disease and we elected not to start her on any steroids.  Her leg was starting to improve on IV antibiotics and she was transitioned to oral antibiotics.  Fever resolved.  Blood cultures remain sterile.  It appeared that the vasculitis was directly related to cellulitis and not a systemic autoimmune phenomenon.  She was started on Lasix and Aldactone.  We also started vitamin K to maximize synthetic production of clotting factors.  Although a paracentesis was recommended to rule out SBP, there was no clinical evidence for this and the patient was treated with a course of antibiotics.  CT showed small volume perihepatic  ascites and we did not want to risk a paracentesis in view of her liver related coagulopathy.  Disposition: Condition stable at time of discharge She will follow-up with gastroenterology.  In view of her young age she should be referred to a [facility] for a liver transplant consultation. There were no complications

## 2017-01-18 LAB — PREALBUMIN: Prealbumin: <5 mg/dL — ABNORMAL LOW (ref 18–38)

## 2017-01-18 LAB — CBC
HEMATOCRIT: 25.2 % — AB (ref 36.0–46.0)
HEMOGLOBIN: 9 g/dL — AB (ref 12.0–15.0)
MCH: 35.7 pg — AB (ref 26.0–34.0)
MCHC: 35.7 g/dL (ref 30.0–36.0)
MCV: 100 fL (ref 78.0–100.0)
Platelets: 93 10*3/uL — ABNORMAL LOW (ref 150–400)
RBC: 2.52 MIL/uL — ABNORMAL LOW (ref 3.87–5.11)
RDW: 15.1 % (ref 11.5–15.5)
WBC: 8.9 10*3/uL (ref 4.0–10.5)

## 2017-01-18 LAB — BASIC METABOLIC PANEL
Anion gap: 8 (ref 5–15)
BUN: 10 mg/dL (ref 6–20)
CALCIUM: 8.3 mg/dL — AB (ref 8.9–10.3)
CHLORIDE: 96 mmol/L — AB (ref 101–111)
CO2: 25 mmol/L (ref 22–32)
CREATININE: 0.66 mg/dL (ref 0.44–1.00)
GFR calc non Af Amer: >60 mL/min (ref >60)
GLUCOSE: 85 mg/dL (ref 65–99)
Potassium: 3.4 mmol/L — ABNORMAL LOW (ref 3.5–5.1)
Sodium: 129 mmol/L — ABNORMAL LOW (ref 135–145)

## 2017-01-18 LAB — AFP TUMOR MARKER: AFP, Serum, Tumor Marker: 4.1 ng/mL (ref 0.0–8.3)

## 2017-01-18 MED ORDER — POTASSIUM CHLORIDE CRYS ER 20 MEQ PO TBCR
40.0000 meq | EXTENDED_RELEASE_TABLET | Freq: Once | ORAL | Status: AC
  Administered 2017-01-18: 40 meq via ORAL
  Filled 2017-01-18: qty 2

## 2017-01-18 MED ORDER — POTASSIUM CHLORIDE CRYS ER 20 MEQ PO TBCR: 20.0000 meq | EXTENDED_RELEASE_TABLET | Freq: Once | ORAL | Status: DC

## 2017-01-18 MED ORDER — FUROSEMIDE 20 MG PO TABS
20.0000 mg | ORAL_TABLET | Freq: Every day | ORAL | Status: DC
  Administered 2017-01-18 – 2017-01-19 (×2): 20 mg via ORAL
  Filled 2017-01-18 (×2): qty 1

## 2017-01-18 MED ORDER — POTASSIUM CHLORIDE CRYS ER 20 MEQ PO TBCR: 40.0000 meq | EXTENDED_RELEASE_TABLET | Freq: Two times a day (BID) | ORAL | Status: DC

## 2017-01-18 MED ORDER — SPIRONOLACTONE 25 MG PO TABS
50.0000 mg | ORAL_TABLET | Freq: Every day | ORAL | Status: DC
  Administered 2017-01-18 – 2017-01-19 (×2): 50 mg via ORAL
  Filled 2017-01-18: qty 2

## 2017-01-18 MED ORDER — POTASSIUM CHLORIDE CRYS ER 20 MEQ PO TBCR: 20.0000 meq | EXTENDED_RELEASE_TABLET | Freq: Two times a day (BID) | ORAL | Status: DC

## 2017-01-18 NOTE — Progress Notes (Signed)
Subjective: no acute events overnight, pt denies any shortness of breath, dizziness or light headedness, chest pain, nausea/vomiting or diarrhea. She has had no fever since evening of 01/16/17 .  She is afebrile this morning and has no chills or discomfort.  She still has some pain in her leg with movement but none at rest.  She feels like the leg has a better color now but is still very swollen.  Discussed with pt results of CT scan and plan.    Objective:  Vital signs in last 24 hours: Vitals:   01/17/17 0613 01/17/17 1442 01/17/17 2111 01/18/17 0605  BP: 115/63 106/69 111/67 113/65  Pulse: (!) 101 97 97 99  Resp: '18 18 20 20  ' Temp: 98.7 F (37.1 C) 97.8 F (36.6 C) 98.7 F (37.1 C) 99 F (37.2 C)  TempSrc: Oral Oral Oral Oral  SpO2: 98% 98% 98% 97%  Weight:      Height:       Physical Exam  Constitutional: She is oriented to person, place, and time. No distress.  Eyes: Right eye exhibits no discharge. Left eye exhibits no discharge. Scleral icterus is present.  Cardiovascular: Normal rate and regular rhythm.  Exam reveals no gallop and no friction rub.   Murmur (1/6 systolic LLSB) heard. Pulmonary/Chest: Effort normal and breath sounds normal. No respiratory distress. She has no wheezes. She has no rales. She exhibits no tenderness.  Abdominal: Soft. Bowel sounds are normal. She exhibits no distension and no mass. There is no tenderness. There is no rebound and no guarding.  Musculoskeletal: She exhibits edema (bilateral LE edema worse on right).  Neurological: She is alert and oriented to person, place, and time.  Skin: She is not diaphoretic.     See HandP or media tab for images, pt has palpable purpura and petichial circumferential rash below the right knee down to the footl.  It is edematous, continually improving in appearance.  Less painful to palpation, still very warm.      Assessment/Plan:  Active Problems:   Vasculitis ([facility])   Hepatic cirrhosis  (H[facility])  Hepatic Cirrhosis: recent diagnosis, etiology yet to be determined.  Pt has a history of excessive alcohol use over the last several months, hepatitis panel is negative, no recent travel, ferriting upper limit of normal.  MPO/PR3 neg, anti smooth muscle antibody weak positive   -Smooth muscle, anti-centromere, anti-mitochondrial, ANCA, PR3 to rule out autoimmune hepatitis, PBC, and PSC -MELD score 28 -pt has low albumin, low platelets, elevated PT, elevated bilirubin, low normal fibrinogen and elevated D dimer -contacted Elizabeth Norton awaiting results for lab tests done there: ANA, ceruloplasmin, copper --CT scan with contrast today to further evaluate liver -GI consulted, appreciate recommendations, pt was started on vit k.  Will slowly institute ascites therapy starting with aldactone 47m/lasix 239m-recheck coags ordered.     Petechial rash with palpable purpura of R Lower extremity: possibly vasculitis, initially no systemic signs of infection, elevated ESR and CRP.  Pt developed fever 102.9 at 5pm 01/15/17 pt started on IV ancef 1g/8hrs.    -C3, C4, UA, ANCA, ANA, MPO, PR3, immunoglobulin levels  -C3 C4 came back mildly low, difficult to interpret in setting of decompensated cirrhosis -MPO/PR3 neg -anti smooth muscle antibody weak positive -biopsy results reveal Leukocytoclastic Vasculitis discussed with dermatologist they feel this may have been precipitated by cellulitis.   -Continue IV ancef  -If pt continues to be afebrile today and blood cultures remain no growth for 72  hours can switch to PO antibiotics and have her follow up with GI outpatient.   Macrocytic Anemia: could be secondary to alcohol abuse or cirrhosis  -pt asymptomatic -Hgb stable at 8.9 today   Electrolyte Derangements   - Hypokalemia:  replacing as needed.    Dispo: Anticipated discharge in approximately 1 day(s).   Elizabeth Roan, MD 01/18/2017, 11:28 AM Elizabeth Muff  MD PGY-1 Internal Medicine Pager # [phone]

## 2017-01-19 LAB — BASIC METABOLIC PANEL
ANION GAP: 6 (ref 5–15)
BUN: 10 mg/dL (ref 6–20)
CHLORIDE: 97 mmol/L — AB (ref 101–111)
CO2: 26 mmol/L (ref 22–32)
Calcium: 8.2 mg/dL — ABNORMAL LOW (ref 8.9–10.3)
Creatinine, Ser: 0.69 mg/dL (ref 0.44–1.00)
GFR calc non Af Amer: >60 mL/min (ref >60)
Glucose, Bld: 87 mg/dL (ref 65–99)
POTASSIUM: 3.7 mmol/L (ref 3.5–5.1)
Sodium: 129 mmol/L — ABNORMAL LOW (ref 135–145)

## 2017-01-19 LAB — CULTURE, BLOOD (ROUTINE X 2)
CULTURE: NO GROWTH
CULTURE: NO GROWTH
SPECIAL REQUESTS: ADEQUATE
Special Requests: ADEQUATE

## 2017-01-19 LAB — PROTIME-INR
INR: 2.03
PROTHROMBIN TIME: 22.7 s — AB (ref 11.4–15.2)

## 2017-01-19 LAB — APTT: aPTT: 50 seconds — ABNORMAL HIGH (ref 24–36)

## 2017-01-19 MED ORDER — POTASSIUM CHLORIDE CRYS ER 20 MEQ PO TBCR
20.0000 meq | EXTENDED_RELEASE_TABLET | Freq: Once | ORAL | Status: AC
  Administered 2017-01-19: 20 meq via ORAL
  Filled 2017-01-19: qty 1

## 2017-01-19 MED ORDER — SPIRONOLACTONE 50 MG PO TABS: 50.0000 mg | ORAL_TABLET | Freq: Every day | ORAL | 0 refills | Status: DC

## 2017-01-19 MED ORDER — CEPHALEXIN 500 MG PO CAPS: 1000.0000 mg | ORAL_CAPSULE | Freq: Two times a day (BID) | ORAL | 0 refills | Status: AC

## 2017-01-19 MED ORDER — FUROSEMIDE 20 MG PO TABS: 20.0000 mg | ORAL_TABLET | Freq: Every day | ORAL | 0 refills | Status: DC

## 2017-01-19 MED ORDER — SPIRONOLACTONE 25 MG PO TABS: 50.0000 mg | ORAL_TABLET | Freq: Every day | ORAL | 0 refills | Status: DC

## 2017-01-19 NOTE — Progress Notes (Signed)
Orthopedic Tech Progress Note Patient Details:  Elizabeth Norton 1975/06/07 [id number]  Ortho Devices Type of Ortho Device: Postop shoe/boot Ortho Device/Splint Location: rle Ortho Device/Splint Interventions: Application   ,  01/19/2017, 11:40 AM

## 2017-01-19 NOTE — Progress Notes (Signed)
Subjective: no acute events overnight, pt denies any shortness of breath, dizziness or light headedness, chest pain, nausea/vomiting or diarrhea. She has had no fever since evening of 01/16/17 .  She is afebrile this morning and has no chills or discomfort.  She still has some pain in her leg with movement but none at rest.  She feels like the leg has a better color , is still swollen but the swelling has improved some from yesterday.      Objective:  Vital signs in last 24 hours: Vitals:   01/18/17 0900 01/18/17 1429 01/18/17 2150 01/19/17 0505  BP:  124/66 123/63 116/63  Pulse:  (!) 103 (!) 101 94  Resp:  '20 18 18  ' Temp:  98.2 F (36.8 C) 99.2 F (37.3 C) 98.8 F (37.1 C)  TempSrc:  Oral Oral   SpO2: 96% 99% 98% 97%  Weight:      Height:       Physical Exam  Constitutional: She is oriented to person, place, and time. No distress.  Eyes: Right eye exhibits no discharge. Left eye exhibits no discharge. Scleral icterus is present.  Cardiovascular: Normal rate and regular rhythm.  Exam reveals no gallop and no friction rub.   Murmur (1/6 systolic LLSB) heard. Pulmonary/Chest: Effort normal and breath sounds normal. No respiratory distress. She has no wheezes. She has no rales. She exhibits no tenderness.  Abdominal: Soft. Bowel sounds are normal. She exhibits no distension and no mass. There is no tenderness. There is no rebound and no guarding.  Musculoskeletal: She exhibits edema (bilateral LE edema worse on right).  Neurological: She is alert and oriented to person, place, and time.  Skin: She is not diaphoretic.     See HandP or media tab for images, pt has palpable purpura and petichial circumferential rash below the right knee down to the footl.  It is edematous, continually improving in appearance.  Less painful to palpation, still very warm.     Media Information     Document Information   Photos    01/19/2017 14:33  Attached To:  Hospital Encounter on 01/14/17    Source Information   , Jenne Pane, MD  [facility]  Media Information     Document Information   Photos    01/19/2017 14:32  Attached To:  Hospital Encounter on 01/14/17  Source Information   , Jenne Pane, MD  [facility]     Assessment/Plan:  Active Problems:   Vasculitis [facility])   Hepatic cirrhosis ([place])  Hepatic Cirrhosis: recent diagnosis, etiology yet to be determined.  Pt has a history of excessive alcohol use over the last several months, hepatitis panel is negative, no recent travel, ferriting upper limit of normal.  MPO/PR3 neg, anti smooth muscle antibody weak positive   -Smooth muscle, anti-centromere, anti-mitochondrial, ANCA, PR3 to rule out autoimmune hepatitis, PBC, and PSC -MELD score 28 -pt has low albumin, low platelets, elevated PT, elevated bilirubin, low normal fibrinogen and elevated D dimer -contacted [facility] awaiting results for lab tests done there: ANA, ceruloplasmin, copper --CT scan with contrast today to further evaluate liver -GI consulted, appreciate recommendations, pt was started on vit k.  Will slowly institute ascites therapy starting with aldactone 47m/lasix 228m-recheck coags show minimal improvement on vit K therapy.   -pt had good response yesterday to aldactone 5015masix 51m66m  Petechial rash with palpable purpura of R Lower extremity: possibly vasculitis, initially no systemic signs of infection, elevated  ESR and CRP.  Pt developed fever 102.9 at 5pm 01/15/17 pt started on IV ancef 1g/8hrs. Now thought to be a vasculitis activated by underlying cellultis   -C3, C4, UA, ANCA, ANA, MPO, PR3, immunoglobulin levels  -C3 C4 came back mildly low, difficult to interpret in setting of decompensated cirrhosis -MPO/PR3 neg -anti smooth muscle antibody weak positive -biopsy results reveal Leukocytoclastic Vasculitis discussed with dermatologist they feel this may have been precipitated by cellulitis.    -Continue IV ancef  -pt continues to be afebrile today and blood cultures remain no growth for 72 hours can switch to PO keflex 1028m BID for 5 additional days and [facility]   Macrocytic Anemia: could be secondary to alcohol abuse or cirrhosis  -pt asymptomatic -Hgb stable at 8.9   Electrolyte Derangements   - Hypokalemia:  replacing as needed.    Dispo: Anticipated discharge home this afternoon  WKatherine Roan MD 01/19/2017, 1:43 PM BVickki MuffMD PGY-1 Internal Medicine Pager # [phone]

## 2017-01-20 LAB — FOLATE RBC
FOLATE, HEMOLYSATE: 193.7 ng/mL
Folate, RBC: 778 ng/mL (ref >498)
HEMATOCRIT: 24.9 % — AB (ref 34.0–46.6)

## 2017-01-20 LAB — CULTURE, BLOOD (ROUTINE X 2)
CULTURE: NO GROWTH
Culture: NO GROWTH
Special Requests: ADEQUATE

## 2017-01-21 ENCOUNTER — Ambulatory Visit: Payer: Self-pay

## 2017-01-23 ENCOUNTER — Ambulatory Visit: Payer: Self-pay | Admitting: Family Medicine

## 2018-07-16 ENCOUNTER — Other Ambulatory Visit: Payer: Self-pay

## 2018-07-16 ENCOUNTER — Emergency Department (HOSPITAL_COMMUNITY): Payer: No Typology Code available for payment source

## 2018-07-16 ENCOUNTER — Emergency Department (HOSPITAL_COMMUNITY)
Admission: EM | Admit: 2018-07-16 | Discharge: 2018-07-16 | Disposition: A | Discharge status: Home / Self Care | Payer: No Typology Code available for payment source | Attending: Emergency Medicine | Admitting: Emergency Medicine

## 2018-07-16 DIAGNOSIS — Y939 Activity, unspecified: Secondary | ICD-10-CM | POA: Diagnosis not present

## 2018-07-16 DIAGNOSIS — Z79899 Other long term (current) drug therapy: Secondary | ICD-10-CM | POA: Diagnosis not present

## 2018-07-16 DIAGNOSIS — Y92410 Unspecified street and highway as the place of occurrence of the external cause: Secondary | ICD-10-CM | POA: Diagnosis not present

## 2018-07-16 DIAGNOSIS — Y998 Other external cause status: Secondary | ICD-10-CM | POA: Insufficient documentation

## 2018-07-16 DIAGNOSIS — M25561 Pain in right knee: Secondary | ICD-10-CM | POA: Diagnosis present

## 2018-07-16 DIAGNOSIS — E7800 Pure hypercholesterolemia, unspecified: Secondary | ICD-10-CM | POA: Insufficient documentation

## 2018-07-16 MED ORDER — METHOCARBAMOL 500 MG PO TABS: 500.0000 mg | ORAL_TABLET | Freq: Every evening | ORAL | 0 refills | Status: DC | PRN

## 2018-07-16 MED ORDER — SODIUM CHLORIDE 0.9 % IV BOLUS
1000.0000 mL | Freq: Once | INTRAVENOUS | Status: AC
  Administered 2018-07-16: 1000 mL via INTRAVENOUS

## 2018-07-16 MED ORDER — ACETAMINOPHEN 325 MG PO TABS
650.0000 mg | ORAL_TABLET | Freq: Once | ORAL | Status: AC
  Administered 2018-07-16: 650 mg via ORAL
  Filled 2018-07-16 (×2): qty 2
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
## 2018-07-16 NOTE — Discharge Instructions (Addendum)
Take ibuprofen 3 times a day with meals.  Take 3-4 tablets at a time. Do not take other anti-inflammatories at the same time (Advil, Motrin, naproxen, Aleve). You may supplement with Tylenol if you need further pain control. Use robaxin as needed for muscle stiffness or soreness.  Have caution, this may make you tired or groggy.  Do not drive or operate heavy machinery while taking this medicine. Use ice packs or heating pads if this helps control your pain. You will likely have continued muscle stiffness and soreness over the next couple days.  Follow-up with primary care in 1 week if your symptoms are not improving. Return to the emergency room if you develop vision changes, vomiting, slurred speech, numbness, loss of bowel or bladder control, or any new or worsening symptoms.
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
## 2020-06-05 LAB — HM COLONOSCOPY

## 2020-07-11 HISTORY — PX: LIVER TRANSPLANT: SHX410

## 2022-12-04 ENCOUNTER — Ambulatory Visit (INDEPENDENT_AMBULATORY_CARE_PROVIDER_SITE_OTHER): Payer: 59 | Admitting: Emergency Medicine

## 2022-12-04 ENCOUNTER — Other Ambulatory Visit: Payer: Self-pay | Admitting: Emergency Medicine

## 2022-12-04 VITALS — BP 128/82 | HR 64 | Temp 97.7°F | Ht 66.0 in | Wt 240.5 lb

## 2022-12-04 DIAGNOSIS — Z130 Encounter for screening for diseases of the blood and blood-forming organs and certain disorders involving the immune mechanism: Secondary | ICD-10-CM | POA: Diagnosis not present

## 2022-12-04 DIAGNOSIS — Z944 Liver transplant status: Secondary | ICD-10-CM | POA: Diagnosis not present

## 2022-12-04 DIAGNOSIS — K439 Ventral hernia without obstruction or gangrene: Secondary | ICD-10-CM

## 2022-12-04 DIAGNOSIS — Z7689 Persons encountering health services in other specified circumstances: Secondary | ICD-10-CM

## 2022-12-04 DIAGNOSIS — Z23 Encounter for immunization: Secondary | ICD-10-CM

## 2022-12-04 DIAGNOSIS — Z13228 Encounter for screening for other metabolic disorders: Secondary | ICD-10-CM | POA: Diagnosis not present

## 2022-12-04 DIAGNOSIS — Z13220 Encounter for screening for lipoid disorders: Secondary | ICD-10-CM | POA: Diagnosis not present

## 2022-12-04 DIAGNOSIS — Z0000 Encounter for general adult medical examination without abnormal findings: Secondary | ICD-10-CM

## 2022-12-04 DIAGNOSIS — Z1329 Encounter for screening for other suspected endocrine disorder: Secondary | ICD-10-CM | POA: Diagnosis not present

## 2022-12-04 LAB — CBC WITH DIFFERENTIAL/PLATELET
Basophils Absolute: 0 10*3/uL (ref 0.0–0.1)
Basophils Relative: 0.5 % (ref 0.0–3.0)
Eosinophils Absolute: 0.2 10*3/uL (ref 0.0–0.7)
Eosinophils Relative: 2.3 % (ref 0.0–5.0)
HCT: 38.1 % (ref 36.0–46.0)
Hemoglobin: 12.6 g/dL (ref 12.0–15.0)
Lymphocytes Relative: 31.1 % (ref 12.0–46.0)
Lymphs Abs: 2.3 10*3/uL (ref 0.7–4.0)
MCHC: 33.1 g/dL (ref 30.0–36.0)
MCV: 111.4 fl — ABNORMAL HIGH (ref 78.0–100.0)
Monocytes Absolute: 0.6 10*3/uL (ref 0.1–1.0)
Monocytes Relative: 8.5 % (ref 3.0–12.0)
Neutro Abs: 4.2 10*3/uL (ref 1.4–7.7)
Neutrophils Relative %: 57.6 % (ref 43.0–77.0)
Platelets: 232 10*3/uL (ref 150.0–400.0)
RBC: 3.42 Mil/uL — ABNORMAL LOW (ref 3.87–5.11)
RDW: 15.9 % — ABNORMAL HIGH (ref 11.5–15.5)
WBC: 7.3 10*3/uL (ref 4.0–10.5)

## 2022-12-04 LAB — COMPREHENSIVE METABOLIC PANEL
ALT: 30 U/L (ref 0–35)
AST: 27 U/L (ref 0–37)
Albumin: 3.8 g/dL (ref 3.5–5.2)
Alkaline Phosphatase: 117 U/L (ref 39–117)
BUN: 19 mg/dL (ref 6–23)
CO2: 26 mEq/L (ref 19–32)
Calcium: 9.5 mg/dL (ref 8.4–10.5)
Chloride: 106 mEq/L (ref 96–112)
Creatinine, Ser: 0.69 mg/dL (ref 0.40–1.20)
GFR: 102.42 mL/min (ref >60.00)
Glucose, Bld: 116 mg/dL — ABNORMAL HIGH (ref 70–99)
Potassium: 4.3 mEq/L (ref 3.5–5.1)
Sodium: 137 mEq/L (ref 135–145)
Total Bilirubin: 0.4 mg/dL (ref 0.2–1.2)
Total Protein: 7.2 g/dL (ref 6.0–8.3)

## 2022-12-04 LAB — LIPID PANEL
Cholesterol: 177 mg/dL (ref 0–200)
HDL: 53.9 mg/dL (ref >39.00)
LDL Cholesterol: 105 mg/dL — ABNORMAL HIGH (ref 0–99)
NonHDL: 123.08
Total CHOL/HDL Ratio: 3
Triglycerides: 90 mg/dL (ref 0.0–149.0)
VLDL: 18 mg/dL (ref 0.0–40.0)

## 2022-12-04 LAB — HEMOGLOBIN A1C: Hgb A1c MFr Bld: 5.6 % (ref 4.6–6.5)

## 2022-12-04 NOTE — Patient Instructions (Signed)

## 2022-12-04 NOTE — Progress Notes (Signed)
Elizabeth Norton 47 y.o.   Chief Complaint  Patient presents with   New Patient (Initial Visit)    Patient wants her physical, patient states she may have a hernia, she states when she coughs she feels a knot in her abd.     HISTORY OF PRESENT ILLNESS: This is a 47 y.o. female first visit to this office, here to establish care with me. Requesting annual physical. History of liver transplant April 2022.  On tacrolimus.  Doing well.  History of fatty liver prior to that. Concerned about possible hernia of mid abdomen. No other complaints or medical concerns today.  HPI   Prior to Admission medications   Medication Sig Start Date End Date Taking? Authorizing Provider  aspirin EC 81 MG tablet Take 1 tablet by mouth daily. 07/31/20  Yes [provider]  Docusate Sodium (DSS) 100 MG CAPS Take by mouth. 09/06/21  Yes [provider]  ENVARSUS XR 1 MG TB24 Take by mouth. 03/29/22  Yes [provider]  magnesium oxide (MAG-OX) 400 MG tablet Take 2 tablets (800 mg total) by mouth 2 times daily. 03/29/22  Yes [provider]  tacrolimus ER (ENVARSUS XR) 4 MG TB24 Take 1 tablet (4 mg total) by mouth every morning before breakfast. 05/02/22 05/02/23 Yes [provider]  losartan (COZAAR) 25 MG tablet Take by mouth. Patient not taking: Reported on 12/04/2022 07/06/21   [provider]  spironolactone (ALDACTONE) 25 MG tablet Take 2 tablets (50 mg total) by mouth daily. Patient not taking: Reported on 07/16/2018 01/19/17 02/18/17  Angelita Ingles, MD    Allergies  Allergen Reactions   Almond Oil Anaphylaxis and Swelling    Other reaction(s): Lip swelling, Swelling around eyes   Penicillins Hives    Had as a child Has patient had a PCN reaction causing immediate rash, facial/tongue/throat swelling, SOB or lightheadedness with hypotension: No Has patient had a PCN reaction causing severe rash involving mucus membranes or skin necrosis: No Has  patient had a PCN reaction that required hospitalization: No Has patient had a PCN reaction occurring within the last 10 years: No If all of the above answers are "NO", then may proceed with Cephalosporin use.   Latex Itching   Penicillin G Rash    Patient Active Problem List   Diagnosis Date Noted   Vasculitis (HCC) 01/14/2017   Hepatic cirrhosis (HCC) 01/14/2017    Past Medical History:  Diagnosis Date   Cirrhosis of liver (HCC)    Depression    Fibromyalgia    High cholesterol     Past Surgical History:  Procedure Laterality Date   right wrist fracture      Social History   Socioeconomic History   Marital status: Married    Spouse name: Not on file   Number of children: Not on file   Years of education: Not on file   Highest education level: Associate degree: occupational, Scientist, product/process development, or vocational program  Occupational History   Not on file  Tobacco Use   Smoking status: Never   Smokeless tobacco: Never  Substance and Sexual Activity   Alcohol use: Yes    Comment: occasional   Drug use: No   Sexual activity: Never  Other Topics Concern   Not on file  Social History Narrative   Not on file   Social Determinants of Health   Financial Resource Strain: Low Risk  (12/04/2022)   Overall Financial Resource Strain (CARDIA)    Difficulty of Paying Living  Expenses: Not very hard  Food Insecurity: No Food Insecurity (12/04/2022)   Hunger Vital Sign    Worried About Running Out of Food in the Last Year: Never true    Ran Out of Food in the Last Year: Never true  Transportation Needs: No Transportation Needs (12/04/2022)   PRAPARE - Administrator, Civil Service (Medical): No    Lack of Transportation (Non-Medical): No  Physical Activity: Unknown (12/04/2022)   Exercise Vital Sign    Days of Exercise per Week: 0 days    Minutes of Exercise per Session: Not on file  Stress: Stress Concern Present (12/04/2022)   [employer] of Occupational Health -  Occupational Stress Questionnaire    Feeling of Stress : To some extent  Social Connections: Unknown (12/04/2022)   Social Connection and Isolation Panel [NHANES]    Frequency of Communication with Friends and Family: More than three times a week    Frequency of Social Gatherings with Friends and Family: Never    Attends Religious Services: Not on Marketing executive or Organizations: No    Attends Engineer, structural: Not on file    Marital Status: Married  Catering manager Violence: Not on file    Family History  Problem Relation Age of Onset   Hyperthyroidism Mother    Hyperthyroidism Sister    Hyperthyroidism Brother      Review of Systems  Constitutional: Negative.  Negative for chills and fever.  HENT: Negative.  Negative for congestion and sore throat.   Respiratory: Negative.  Negative for cough and shortness of breath.   Cardiovascular: Negative.  Negative for chest pain and palpitations.  Gastrointestinal:  Negative for abdominal pain, diarrhea, nausea and vomiting.  Genitourinary: Negative.  Negative for dysuria and hematuria.  Skin: Negative.  Negative for rash.  Neurological: Negative.  Negative for dizziness and headaches.  All other systems reviewed and are negative.   Vitals:   12/04/22 0938  BP: 128/82  Pulse: 64  Temp: 97.7 F (36.5 C)  SpO2: 99%    Physical Exam Vitals reviewed.  Constitutional:      Appearance: Normal appearance.  HENT:     Head: Normocephalic.     Right Ear: Tympanic membrane, ear canal and external ear normal.     Left Ear: Tympanic membrane, ear canal and external ear normal.     Mouth/Throat:     Mouth: Mucous membranes are moist.     Pharynx: Oropharynx is clear.  Eyes:     Extraocular Movements: Extraocular movements intact.     Conjunctiva/sclera: Conjunctivae normal.     Pupils: Pupils are equal, round, and reactive to light.  Cardiovascular:     Rate and Rhythm: Normal rate and regular rhythm.      Pulses: Normal pulses.     Heart sounds: Normal heart sounds.  Pulmonary:     Effort: Pulmonary effort is normal.     Breath sounds: Normal breath sounds.  Abdominal:     Palpations: Abdomen is soft.     Tenderness: There is no abdominal tenderness.     Hernia: A hernia (Above bellybutton) is present.     Comments: All surgical scars  Musculoskeletal:     Cervical back: No tenderness.  Lymphadenopathy:     Cervical: No cervical adenopathy.  Skin:    General: Skin is warm and dry.     Capillary Refill: Capillary refill takes less than 2 seconds.  Neurological:  General: No focal deficit present.     Mental Status: She is alert and oriented to person, place, and time.  Psychiatric:        Mood and Affect: Mood normal.        Behavior: Behavior normal.      ASSESSMENT & PLAN: Problem List Items Addressed This Visit   None Visit Diagnoses     Routine general medical examination at a health care facility    -  Primary   Relevant Orders   CBC with Differential   Comprehensive metabolic panel   Hemoglobin A1c   Lipid panel   Screening for deficiency anemia       Relevant Orders   CBC with Differential   Screening for lipoid disorders       Relevant Orders   Lipid panel   Screening for endocrine, metabolic and immunity disorder       Relevant Orders   Comprehensive metabolic panel   Hemoglobin A1c   History of liver transplant (HCC)       Relevant Orders   Tacrolimus level   CMV abs, IgG+IgM (cytomegalovirus)   Need for vaccination       Relevant Orders   Flu vaccine trivalent PF, 6mos and older(Flulaval,Afluria,Fluarix,Fluzone) (Completed)   Ventral hernia without obstruction or gangrene       Relevant Orders   US Abdomen Complete     Modifiable risk factors discussed with patient. Anticipatory guidance according to age provided. The following topics were also discussed: Social Determinants of Health Smoking.  Non-smoker Diet and nutrition Benefits  of exercise Cancer family history review Vaccinations review and recommendations Cardiovascular risk assessment and need for blood work Mental health including depression and anxiety Fall and accident prevention  Patient Instructions  Health Maintenance, Female Adopting a healthy lifestyle and getting preventive care are important in promoting health and wellness. Ask your health care provider about: The right schedule for you to have regular tests and exams. Things you can do on your own to prevent diseases and keep yourself healthy. What should I know about diet, weight, and exercise? Eat a healthy diet  Eat a diet that includes plenty of vegetables, fruits, low-fat dairy products, and lean protein. Do not eat a lot of foods that are high in solid fats, added sugars, or sodium. Maintain a healthy weight Body mass index (BMI) is used to identify weight problems. It estimates body fat based on height and weight. Your health care provider can help determine your BMI and help you achieve or maintain a healthy weight. Get regular exercise Get regular exercise. This is one of the most important things you can do for your health. Most adults should: Exercise for at least 150 minutes each week. The exercise should increase your heart rate and make you sweat (moderate-intensity exercise). Do strengthening exercises at least twice a week. This is in addition to the moderate-intensity exercise. Spend less time sitting. Even light physical activity can be beneficial. Watch cholesterol and blood lipids Have your blood tested for lipids and cholesterol at 49 years of age, then have this test every 5 years. Have your cholesterol levels checked more often if: Your lipid or cholesterol levels are high. You are older than 49 years of age. You are at high risk for heart disease. What should I know about cancer screening? Depending on your health history and family history, you may need to have cancer  screening at various ages. This may include screening for: Breast cancer. Cervical  cancer. Colorectal cancer. Skin cancer. Lung cancer. What should I know about heart disease, diabetes, and high blood pressure? Blood pressure and heart disease High blood pressure causes heart disease and increases the risk of stroke. This is more likely to develop in people who have high blood pressure readings or are overweight. Have your blood pressure checked: Every 3-5 years if you are 69-35 years of age. Every year if you are 22 years old or older. Diabetes Have regular diabetes screenings. This checks your fasting blood sugar level. Have the screening done: Once every three years after age 66 if you are at a normal weight and have a low risk for diabetes. More often and at a younger age if you are overweight or have a high risk for diabetes. What should I know about preventing infection? Hepatitis B If you have a higher risk for hepatitis B, you should be screened for this virus. Talk with your health care provider to find out if you are at risk for hepatitis B infection. Hepatitis C Testing is recommended for: Everyone born from [birth year] through 1967. Anyone with known risk factors for hepatitis C. Sexually transmitted infections (STIs) Get screened for STIs, including gonorrhea and chlamydia, if: You are sexually active and are younger than 49 years of age. You are older than 49 years of age and your health care provider tells you that you are at risk for this type of infection. Your sexual activity has changed since you were last screened, and you are at increased risk for chlamydia or gonorrhea. Ask your health care provider if you are at risk. Ask your health care provider about whether you are at high risk for HIV. Your health care provider may recommend a prescription medicine to help prevent HIV infection. If you choose to take medicine to prevent HIV, you should first get tested for HIV. You  should then be tested every 3 months for as long as you are taking the medicine. Pregnancy If you are about to stop having your period (premenopausal) and you may become pregnant, seek counseling before you get pregnant. Take 400 to 800 micrograms (mcg) of folic acid every day if you become pregnant. Ask for birth control (contraception) if you want to prevent pregnancy. Osteoporosis and menopause Osteoporosis is a disease in which the bones lose minerals and strength with aging. This can result in bone fractures. If you are 68 years old or older, or if you are at risk for osteoporosis and fractures, ask your health care provider if you should: Be screened for bone loss. Take a calcium or vitamin D supplement to lower your risk of fractures. Be given hormone replacement therapy (HRT) to treat symptoms of menopause. Follow these instructions at home: Alcohol use Do not drink alcohol if: Your health care provider tells you not to drink. You are pregnant, may be pregnant, or are planning to become pregnant. If you drink alcohol: Limit how much you have to: 0-1 drink a day. Know how much alcohol is in your drink. In the [place] one drink equals one 12 oz bottle of beer (355 mL), one 5 oz glass of wine (148 mL), or one 1 oz glass of hard liquor (44 mL). Lifestyle Do not use any products that contain nicotine or tobacco. These products include cigarettes, chewing tobacco, and vaping devices, such as e-cigarettes. If you need help quitting, ask your health care provider. Do not use street drugs. Do not share needles. Ask your health care provider for help  if you need support or information about quitting drugs. General instructions Schedule regular health, dental, and eye exams. Stay current with your vaccines. Tell your health care provider if: You often feel depressed. You have ever been abused or do not feel safe at home. Summary Adopting a healthy lifestyle and getting preventive care are  important in promoting health and wellness. Follow your health care provider's instructions about healthy diet, exercising, and getting tested or screened for diseases. Follow your health care provider's instructions on monitoring your cholesterol and blood pressure. This information is not intended to replace advice given to you by your health care provider. Make sure you discuss any questions you have with your health care provider. Document Revised: 08/14/2020 Document Reviewed: 08/14/2020 Elsevier Patient Education  2024 Elsevier Inc.      Edwina Barth, MD [facility]

## 2022-12-05 LAB — CMV ABS, IGG+IGM (CYTOMEGALOVIRUS)
CMV IgM: <30 [AU]/ml
Cytomegalovirus Ab-IgG: >10 U/mL — ABNORMAL HIGH

## 2022-12-07 LAB — TACROLIMUS LEVEL: Tacrolimus (FK506), Blood: 3.5 ng/mL (ref 2.0–20.0)

## 2022-12-11 ENCOUNTER — Other Ambulatory Visit (HOSPITAL_COMMUNITY): Payer: Self-pay

## 2022-12-11 ENCOUNTER — Encounter: Payer: Self-pay | Admitting: Internal Medicine

## 2022-12-11 ENCOUNTER — Ambulatory Visit (INDEPENDENT_AMBULATORY_CARE_PROVIDER_SITE_OTHER): Payer: 59 | Admitting: Internal Medicine

## 2022-12-11 VITALS — BP 132/82 | HR 94 | Temp 98.1°F | Ht 66.0 in | Wt 240.0 lb

## 2022-12-11 DIAGNOSIS — M797 Fibromyalgia: Secondary | ICD-10-CM | POA: Insufficient documentation

## 2022-12-11 DIAGNOSIS — R519 Headache, unspecified: Secondary | ICD-10-CM

## 2022-12-11 DIAGNOSIS — K439 Ventral hernia without obstruction or gangrene: Secondary | ICD-10-CM

## 2022-12-11 DIAGNOSIS — F32A Depression, unspecified: Secondary | ICD-10-CM

## 2022-12-11 DIAGNOSIS — E7800 Pure hypercholesterolemia, unspecified: Secondary | ICD-10-CM | POA: Insufficient documentation

## 2022-12-11 DIAGNOSIS — J069 Acute upper respiratory infection, unspecified: Secondary | ICD-10-CM | POA: Diagnosis not present

## 2022-12-11 LAB — POC COVID19 BINAXNOW: SARS Coronavirus 2 Ag: NEGATIVE

## 2022-12-11 MED ORDER — AZITHROMYCIN 250 MG PO TABS
ORAL_TABLET | ORAL | 1 refills | Status: AC
  Filled 2022-12-11: qty 6, 5d supply, fill #0

## 2022-12-11 MED ORDER — HYDROCODONE BIT-HOMATROP MBR 5-1.5 MG/5ML PO SOLN
5.0000 mL | Freq: Four times a day (QID) | ORAL | 0 refills | Status: AC | PRN
Start: 2022-12-11 — End: 2022-12-21
  Filled 2022-12-11: qty 180, 9d supply, fill #0

## 2022-12-11 NOTE — Progress Notes (Signed)
Patient ID: Elizabeth Norton, female   DOB: 03-Apr-1976, 47 y.o.   MRN: [id number]        Chief Complaint: follow up URI, ventral hernia s/p liver transplant, hld, depression       HPI:  Elizabeth Norton is a 47 y.o. female  Here with 2-3 days acute onset fever, facial pain, pressure, headache, general weakness and malaise, and greenish d/c, with mild ST and cough, but pt denies chest pain, wheezing, increased sob or doe, orthopnea, PND, increased LE swelling, palpitations, dizziness or syncope. Did have home covid testing neg.   Denies worsening reflux, abd pain, dysphagia, n/v, bowel change or blood.  Denies worsening depressive symptoms, suicidal ideation, or panic; Trying to follow low fat low chol diet, though has gained significant wt in last 4 yrs of 40 lbs          Wt Readings from Last 3 Encounters:  12/11/22 240 lb (108.9 kg)  12/04/22 240 lb 8 oz (109.1 kg)  07/16/18 200 lb (90.7 kg)   BP Readings from Last 3 Encounters:  12/11/22 132/82  12/04/22 128/82  07/16/18 (!) 145/78         Past Medical History:  Diagnosis Date   Cirrhosis of liver (HCC)    Depression    Fibromyalgia    High cholesterol    Past Surgical History:  Procedure Laterality Date   right wrist fracture      reports that she has never smoked. She has never used smokeless tobacco. She reports current alcohol use. She reports that she does not use drugs. family history includes Hyperthyroidism in her brother, mother, and sister. Allergies  Allergen Reactions   Almond Oil Anaphylaxis and Swelling    Other reaction(s): Lip swelling, Swelling around eyes   Penicillins Hives    Had as a child Has patient had a PCN reaction causing immediate rash, facial/tongue/throat swelling, SOB or lightheadedness with hypotension: No Has patient had a PCN reaction causing severe rash involving mucus membranes or skin necrosis: No Has patient had a PCN reaction that required hospitalization: No Has patient had a PCN reaction  occurring within the last 10 years: No If all of the above answers are "NO", then may proceed with Cephalosporin use.   Latex Itching   Penicillin G Rash   Current Outpatient Medications on File Prior to Visit  Medication Sig Dispense Refill   aspirin EC 81 MG tablet Take 1 tablet by mouth daily.     Docusate Sodium (DSS) 100 MG CAPS Take by mouth.     ENVARSUS XR 1 MG TB24 Take by mouth.     magnesium oxide (MAG-OX) 400 MG tablet Take 2 tablets (800 mg total) by mouth 2 times daily.     tacrolimus ER (ENVARSUS XR) 4 MG TB24 Take 1 tablet (4 mg total) by mouth every morning before breakfast.     losartan (COZAAR) 25 MG tablet Take by mouth. (Patient not taking: Reported on 12/04/2022)     No current facility-administered medications on file prior to visit.        ROS:  All others reviewed and negative.  Objective        PE:  BP 132/82 (BP Location: Right Arm, Patient Position: Sitting, Cuff Size: Normal)   Pulse 94   Temp 98.1 F (36.7 C) (Oral)   Ht 5\' 6"  (1.676 m)   Wt 240 lb (108.9 kg)   SpO2 99%   BMI 38.74 kg/m  Constitutional: Pt appears in NAD, mild ill               HENT: Head: NCAT.                Right Ear: External ear normal.                 Left Ear: External ear normal. Bilat tm's with mild erythema.  Max sinus areas non tender.  Pharynx with mild erythema, no exudate               Eyes: . Pupils are equal, round, and reactive to light. Conjunctivae and EOM are normal               Nose: without d/c or deformity               Neck: Neck supple. Gross normal ROM               Cardiovascular: Normal rate and regular rhythm.                 Pulmonary/Chest: Effort normal and breath sounds without rales or wheezing.                Abd:  Soft, NT, ND, + BS, no organomegaly               Neurological: Pt is alert. At baseline orientation, motor grossly intact               Skin: Skin is warm. No rashes, no other new lesions, LE edema - none                Psychiatric: Pt behavior is normal without agitation   Micro: none  Cardiac tracings I have personally interpreted today:  none  Pertinent Radiological findings (summarize): none   Lab Results  Component Value Date   WBC 7.3 12/04/2022   HGB 12.6 12/04/2022   HCT 38.1 12/04/2022   PLT 232.0 12/04/2022   GLUCOSE 116 (H) 12/04/2022   CHOL 177 12/04/2022   TRIG 90.0 12/04/2022   HDL 53.90 12/04/2022   LDLCALC 105 (H) 12/04/2022   ALT 30 12/04/2022   AST 27 12/04/2022   NA 137 12/04/2022   K 4.3 12/04/2022   CL 106 12/04/2022   CREATININE 0.69 12/04/2022   BUN 19 12/04/2022   CO2 26 12/04/2022   TSH 4.03 12/26/2016   INR 2.03 01/19/2017   HGBA1C 5.6 12/04/2022   COVID - negative (POCT)  Assessment/Plan:  Elizabeth Norton is a 47 y.o. Unavailable [8] female with  has a past medical history of Cirrhosis of liver (HCC), Depression, Fibromyalgia, and High cholesterol.  High cholesterol Lab Results  Component Value Date   LDLCALC 105 (H) 12/04/2022   Uncontrolled, goal ldl < 100, pt to continue with lower cholesterol diet, declines statin given liver hx   Depression Overall stable, declines need for further change in tx at this time, or counseling referral  Acute upper respiratory infection Mild to mod, for antibx course - zpack, cough med prn,  to f/u any worsening symptoms or concerns  Ventral hernia without obstruction or gangrene Chronic stable asympt,  to f/u any worsening symptoms or concerns  Followup: Return if symptoms worsen or fail to improve.  Oliver Barre, MD 12/14/2022 6:51 AM [facility]

## 2022-12-11 NOTE — Patient Instructions (Signed)
You are given the work note today  Your COVID testing was negative  Please take all new medication as prescribed - the antibiotic, and cough medicine as needed  Please continue all other medications as before, and refills have been done if requested.  Please have the pharmacy call with any other refills you may need  Please keep your appointments with your specialists as you may have planned

## 2022-12-13 ENCOUNTER — Ambulatory Visit
Admission: RE | Admit: 2022-12-13 | Discharge: 2022-12-13 | Disposition: A | Discharge status: Home / Self Care | Payer: 59 | Source: Ambulatory Visit | Attending: Emergency Medicine

## 2022-12-13 DIAGNOSIS — K439 Ventral hernia without obstruction or gangrene: Secondary | ICD-10-CM

## 2022-12-14 ENCOUNTER — Encounter: Payer: Self-pay | Admitting: Internal Medicine

## 2022-12-14 DIAGNOSIS — J069 Acute upper respiratory infection, unspecified: Secondary | ICD-10-CM | POA: Insufficient documentation

## 2022-12-14 NOTE — Assessment & Plan Note (Signed)
Lab Results  Component Value Date   LDLCALC 105 (H) 12/04/2022   Uncontrolled, goal ldl < 100, pt to continue with lower cholesterol diet, declines statin given liver hx

## 2022-12-14 NOTE — Assessment & Plan Note (Signed)
Overall stable, declines need for further change in tx at this time, or counseling referral

## 2022-12-14 NOTE — Assessment & Plan Note (Signed)
Mild to mod, for antibx course zpack, cough med prn,  to f/u any worsening symptoms or concerns 

## 2022-12-14 NOTE — Assessment & Plan Note (Signed)
Chronic stable asympt,  to f/u any worsening symptoms or concerns

## 2022-12-17 ENCOUNTER — Other Ambulatory Visit (HOSPITAL_COMMUNITY): Payer: Self-pay

## 2022-12-17 MED ORDER — ASPIRIN 81 MG PO TBEC
81.0000 mg | DELAYED_RELEASE_TABLET | Freq: Every day | ORAL | 3 refills | Status: DC
  Filled 2022-12-17: qty 90, 90d supply, fill #0
  Filled 2023-08-20: qty 90, 90d supply, fill #1
  Filled 2023-12-05: qty 90, 90d supply, fill #2

## 2022-12-17 MED ORDER — ENVARSUS XR 4 MG PO TB24
4.0000 mg | ORAL_TABLET | Freq: Every day | ORAL | 3 refills | Status: DC
  Filled 2022-12-17: qty 90, 90d supply, fill #0
  Filled 2023-05-23: qty 90, 90d supply, fill #1

## 2022-12-17 MED ORDER — ENVARSUS XR 1 MG PO TB24
1.0000 mg | ORAL_TABLET | Freq: Every day | ORAL | 3 refills | Status: DC
  Filled 2022-12-17: qty 90, 90d supply, fill #0
  Filled 2023-05-23: qty 90, 90d supply, fill #1
  Filled 2023-07-16: qty 30, 30d supply, fill #2

## 2022-12-24 ENCOUNTER — Other Ambulatory Visit: Payer: Self-pay | Admitting: Emergency Medicine

## 2022-12-24 DIAGNOSIS — K439 Ventral hernia without obstruction or gangrene: Secondary | ICD-10-CM

## 2022-12-26 ENCOUNTER — Other Ambulatory Visit (HOSPITAL_COMMUNITY): Payer: Self-pay

## 2023-01-06 ENCOUNTER — Other Ambulatory Visit (HOSPITAL_COMMUNITY): Payer: Self-pay

## 2023-01-15 ENCOUNTER — Other Ambulatory Visit: Payer: Self-pay | Admitting: General Surgery

## 2023-01-15 DIAGNOSIS — Z949 Transplanted organ and tissue status, unspecified: Secondary | ICD-10-CM | POA: Diagnosis not present

## 2023-01-15 DIAGNOSIS — K432 Incisional hernia without obstruction or gangrene: Secondary | ICD-10-CM | POA: Diagnosis not present

## 2023-01-15 DIAGNOSIS — Z944 Liver transplant status: Secondary | ICD-10-CM

## 2023-01-15 DIAGNOSIS — E66812 Obesity, class 2: Secondary | ICD-10-CM | POA: Diagnosis not present

## 2023-01-17 ENCOUNTER — Encounter: Payer: Self-pay | Admitting: General Surgery

## 2023-01-31 ENCOUNTER — Ambulatory Visit
Admission: RE | Admit: 2023-01-31 | Discharge: 2023-01-31 | Disposition: A | Discharge status: Home / Self Care | Payer: 59 | Source: Ambulatory Visit | Attending: General Surgery | Admitting: General Surgery

## 2023-01-31 DIAGNOSIS — Z944 Liver transplant status: Secondary | ICD-10-CM

## 2023-01-31 DIAGNOSIS — R1033 Periumbilical pain: Secondary | ICD-10-CM | POA: Diagnosis not present

## 2023-01-31 DIAGNOSIS — K458 Other specified abdominal hernia without obstruction or gangrene: Secondary | ICD-10-CM | POA: Diagnosis not present

## 2023-01-31 DIAGNOSIS — K429 Umbilical hernia without obstruction or gangrene: Secondary | ICD-10-CM | POA: Diagnosis not present

## 2023-01-31 DIAGNOSIS — K432 Incisional hernia without obstruction or gangrene: Secondary | ICD-10-CM

## 2023-01-31 MED ORDER — IOPAMIDOL (ISOVUE-300) INJECTION 61%
100.0000 mL | Freq: Once | INTRAVENOUS | Status: AC | PRN
  Administered 2023-01-31: 100 mL via INTRAVENOUS

## 2023-02-03 ENCOUNTER — Other Ambulatory Visit (HOSPITAL_COMMUNITY): Payer: Self-pay

## 2023-02-03 ENCOUNTER — Other Ambulatory Visit: Payer: Self-pay | Admitting: Emergency Medicine

## 2023-02-03 ENCOUNTER — Encounter (HOSPITAL_COMMUNITY): Payer: Self-pay

## 2023-02-03 DIAGNOSIS — Z9189 Other specified personal risk factors, not elsewhere classified: Secondary | ICD-10-CM

## 2023-02-03 MED ORDER — WEGOVY 0.25 MG/0.5ML ~~LOC~~ SOAJ
0.2500 mg | SUBCUTANEOUS | 3 refills | Status: DC
  Filled 2023-02-03: qty 2, 28d supply, fill #0

## 2023-02-18 ENCOUNTER — Other Ambulatory Visit: Payer: Self-pay | Admitting: Emergency Medicine

## 2023-02-21 DIAGNOSIS — Z949 Transplanted organ and tissue status, unspecified: Secondary | ICD-10-CM | POA: Diagnosis not present

## 2023-02-21 DIAGNOSIS — K439 Ventral hernia without obstruction or gangrene: Secondary | ICD-10-CM | POA: Diagnosis not present

## 2023-02-21 DIAGNOSIS — K432 Incisional hernia without obstruction or gangrene: Secondary | ICD-10-CM | POA: Diagnosis not present

## 2023-02-26 ENCOUNTER — Other Ambulatory Visit: Payer: Self-pay

## 2023-03-04 ENCOUNTER — Emergency Department (HOSPITAL_COMMUNITY): Payer: 59

## 2023-03-04 ENCOUNTER — Other Ambulatory Visit: Payer: Self-pay

## 2023-03-04 ENCOUNTER — Emergency Department (HOSPITAL_BASED_OUTPATIENT_CLINIC_OR_DEPARTMENT_OTHER): Payer: 59 | Admitting: Anesthesiology

## 2023-03-04 ENCOUNTER — Encounter (HOSPITAL_COMMUNITY)
Admission: EM | Disposition: A | Discharge status: Home / Self Care | Payer: Self-pay | Source: Home / Self Care | Attending: Emergency Medicine

## 2023-03-04 ENCOUNTER — Ambulatory Visit (HOSPITAL_COMMUNITY)
Admission: EM | Admit: 2023-03-04 | Discharge: 2023-03-05 | Disposition: A | Discharge status: Home / Self Care | Payer: 59 | Attending: Surgery | Admitting: Surgery

## 2023-03-04 ENCOUNTER — Encounter (HOSPITAL_COMMUNITY): Payer: Self-pay

## 2023-03-04 ENCOUNTER — Emergency Department (HOSPITAL_COMMUNITY): Payer: 59 | Admitting: Anesthesiology

## 2023-03-04 DIAGNOSIS — E7800 Pure hypercholesterolemia, unspecified: Secondary | ICD-10-CM | POA: Diagnosis not present

## 2023-03-04 DIAGNOSIS — K430 Incisional hernia with obstruction, without gangrene: Secondary | ICD-10-CM | POA: Diagnosis present

## 2023-03-04 DIAGNOSIS — K460 Unspecified abdominal hernia with obstruction, without gangrene: Secondary | ICD-10-CM

## 2023-03-04 DIAGNOSIS — Z944 Liver transplant status: Secondary | ICD-10-CM | POA: Diagnosis not present

## 2023-03-04 DIAGNOSIS — L03311 Cellulitis of abdominal wall: Secondary | ICD-10-CM | POA: Diagnosis not present

## 2023-03-04 DIAGNOSIS — K7460 Unspecified cirrhosis of liver: Secondary | ICD-10-CM | POA: Insufficient documentation

## 2023-03-04 DIAGNOSIS — R109 Unspecified abdominal pain: Secondary | ICD-10-CM | POA: Diagnosis not present

## 2023-03-04 DIAGNOSIS — F32A Depression, unspecified: Secondary | ICD-10-CM | POA: Diagnosis not present

## 2023-03-04 DIAGNOSIS — K432 Incisional hernia without obstruction or gangrene: Secondary | ICD-10-CM | POA: Diagnosis not present

## 2023-03-04 DIAGNOSIS — K469 Unspecified abdominal hernia without obstruction or gangrene: Secondary | ICD-10-CM | POA: Diagnosis not present

## 2023-03-04 HISTORY — PX: INCISIONAL HERNIA REPAIR: SHX193

## 2023-03-04 LAB — COMPREHENSIVE METABOLIC PANEL
ALT: 22 U/L (ref 0–44)
AST: 26 U/L (ref 15–41)
Albumin: 3 g/dL — ABNORMAL LOW (ref 3.5–5.0)
Alkaline Phosphatase: 96 U/L (ref 38–126)
Anion gap: 10 (ref 5–15)
BUN: 15 mg/dL (ref 6–20)
CO2: 23 mmol/L (ref 22–32)
Calcium: 8.7 mg/dL — ABNORMAL LOW (ref 8.9–10.3)
Chloride: 105 mmol/L (ref 98–111)
Creatinine, Ser: 0.7 mg/dL (ref 0.44–1.00)
GFR, Estimated: >60 mL/min (ref >60)
Glucose, Bld: 138 mg/dL — ABNORMAL HIGH (ref 70–99)
Potassium: 3.8 mmol/L (ref 3.5–5.1)
Sodium: 138 mmol/L (ref 135–145)
Total Bilirubin: 0.7 mg/dL (ref <1.2)
Total Protein: 6.8 g/dL (ref 6.5–8.1)

## 2023-03-04 LAB — CBC WITH DIFFERENTIAL/PLATELET
Abs Immature Granulocytes: 0.02 10*3/uL (ref 0.00–0.07)
Basophils Absolute: 0 10*3/uL (ref 0.0–0.1)
Basophils Relative: 1 %
Eosinophils Absolute: 0.1 10*3/uL (ref 0.0–0.5)
Eosinophils Relative: 1 %
HCT: 31.9 % — ABNORMAL LOW (ref 36.0–46.0)
Hemoglobin: 11.3 g/dL — ABNORMAL LOW (ref 12.0–15.0)
Immature Granulocytes: 0 %
Lymphocytes Relative: 23 %
Lymphs Abs: 1.3 10*3/uL (ref 0.7–4.0)
MCH: 39 pg — ABNORMAL HIGH (ref 26.0–34.0)
MCHC: 35.4 g/dL (ref 30.0–36.0)
MCV: 110 fL — ABNORMAL HIGH (ref 80.0–100.0)
Monocytes Absolute: 0.6 10*3/uL (ref 0.1–1.0)
Monocytes Relative: 10 %
Neutro Abs: 3.7 10*3/uL (ref 1.7–7.7)
Neutrophils Relative %: 65 %
Platelets: 137 10*3/uL — ABNORMAL LOW (ref 150–400)
RBC: 2.9 MIL/uL — ABNORMAL LOW (ref 3.87–5.11)
RDW: 18.6 % — ABNORMAL HIGH (ref 11.5–15.5)
WBC: 5.7 10*3/uL (ref 4.0–10.5)
nRBC: 0 % (ref 0.0–0.2)

## 2023-03-04 SURGERY — REPAIR, HERNIA, INCISIONAL
Anesthesia: General

## 2023-03-04 MED ORDER — FENTANYL CITRATE PF 50 MCG/ML IJ SOSY
25.0000 ug | PREFILLED_SYRINGE | INTRAMUSCULAR | Status: DC | PRN
  Administered 2023-03-04: 50 ug via INTRAVENOUS

## 2023-03-04 MED ORDER — LACTATED RINGERS IV SOLN: INTRAVENOUS | Status: DC

## 2023-03-04 MED ORDER — OXYCODONE HCL 5 MG PO TABS
5.0000 mg | ORAL_TABLET | Freq: Once | ORAL | Status: AC | PRN
  Administered 2023-03-04: 5 mg via ORAL

## 2023-03-04 MED ORDER — SODIUM CHLORIDE 0.9 % IV SOLN
2.0000 g | Freq: Three times a day (TID) | INTRAVENOUS | Status: DC
  Administered 2023-03-04: 2 g via INTRAVENOUS
  Filled 2023-03-04: qty 12.5

## 2023-03-04 MED ORDER — TACROLIMUS ER 1 MG PO TB24
1.0000 mg | ORAL_TABLET | Freq: Every evening | ORAL | Status: DC
  Administered 2023-03-04: 1 mg via ORAL
  Filled 2023-03-04: qty 1

## 2023-03-04 MED ORDER — HYDROMORPHONE HCL 2 MG/ML IJ SOLN
INTRAMUSCULAR | Status: AC
  Filled 2023-03-04: qty 1

## 2023-03-04 MED ORDER — FENTANYL CITRATE (PF) 100 MCG/2ML IJ SOLN
INTRAMUSCULAR | Status: AC
  Filled 2023-03-04: qty 2

## 2023-03-04 MED ORDER — ACETAMINOPHEN 325 MG PO TABS
650.0000 mg | ORAL_TABLET | Freq: Four times a day (QID) | ORAL | Status: DC | PRN
  Administered 2023-03-04 – 2023-03-05 (×2): 650 mg via ORAL
  Filled 2023-03-04 (×2): qty 2

## 2023-03-04 MED ORDER — ONDANSETRON 4 MG PO TBDP: 4.0000 mg | ORAL_TABLET | Freq: Four times a day (QID) | ORAL | Status: DC | PRN

## 2023-03-04 MED ORDER — ACETAMINOPHEN 650 MG RE SUPP: 650.0000 mg | Freq: Four times a day (QID) | RECTAL | Status: DC | PRN

## 2023-03-04 MED ORDER — ROCURONIUM BROMIDE 10 MG/ML (PF) SYRINGE
PREFILLED_SYRINGE | INTRAVENOUS | Status: DC | PRN
  Administered 2023-03-04: 60 mg via INTRAVENOUS

## 2023-03-04 MED ORDER — ONDANSETRON HCL 4 MG/2ML IJ SOLN
INTRAMUSCULAR | Status: AC
Start: 2023-03-04 — End: ?
  Filled 2023-03-04: qty 2

## 2023-03-04 MED ORDER — LIDOCAINE HCL (PF) 2 % IJ SOLN
INTRAMUSCULAR | Status: AC
  Filled 2023-03-04: qty 5

## 2023-03-04 MED ORDER — OXYCODONE HCL 5 MG/5ML PO SOLN
5.0000 mg | Freq: Once | ORAL | Status: AC | PRN
Start: 2023-03-04 — End: 2023-03-04

## 2023-03-04 MED ORDER — DROPERIDOL 2.5 MG/ML IJ SOLN: 0.6250 mg | Freq: Once | INTRAMUSCULAR | Status: DC | PRN

## 2023-03-04 MED ORDER — DEXTROSE-SODIUM CHLORIDE 5-0.9 % IV SOLN: INTRAVENOUS | Status: DC

## 2023-03-04 MED ORDER — DEXAMETHASONE SODIUM PHOSPHATE 10 MG/ML IJ SOLN
INTRAMUSCULAR | Status: AC
  Filled 2023-03-04: qty 1

## 2023-03-04 MED ORDER — OXYCODONE HCL 5 MG PO TABS
ORAL_TABLET | ORAL | Status: AC
  Filled 2023-03-04: qty 1

## 2023-03-04 MED ORDER — ONDANSETRON HCL 4 MG/2ML IJ SOLN: 4.0000 mg | Freq: Four times a day (QID) | INTRAMUSCULAR | Status: DC | PRN

## 2023-03-04 MED ORDER — TRAMADOL HCL 50 MG PO TABS
50.0000 mg | ORAL_TABLET | Freq: Four times a day (QID) | ORAL | Status: DC | PRN
  Filled 2023-03-04: qty 1

## 2023-03-04 MED ORDER — HYDROMORPHONE HCL 1 MG/ML IJ SOLN
INTRAMUSCULAR | Status: DC | PRN
  Administered 2023-03-04: 1 mg via INTRAVENOUS
  Administered 2023-03-04: .5 mg via INTRAVENOUS

## 2023-03-04 MED ORDER — ONDANSETRON HCL 4 MG/2ML IJ SOLN
4.0000 mg | Freq: Once | INTRAMUSCULAR | Status: DC
  Filled 2023-03-04: qty 2

## 2023-03-04 MED ORDER — VANCOMYCIN HCL 1500 MG/300ML IV SOLN
1500.0000 mg | Freq: Once | INTRAVENOUS | Status: AC
  Administered 2023-03-04: 1500 mg via INTRAVENOUS
  Filled 2023-03-04 (×2): qty 300

## 2023-03-04 MED ORDER — METRONIDAZOLE 500 MG/100ML IV SOLN
500.0000 mg | Freq: Two times a day (BID) | INTRAVENOUS | Status: DC
  Administered 2023-03-04: 500 mg via INTRAVENOUS
  Filled 2023-03-04: qty 100

## 2023-03-04 MED ORDER — MIDAZOLAM HCL 2 MG/2ML IJ SOLN
INTRAMUSCULAR | Status: DC | PRN
  Administered 2023-03-04: 2 mg via INTRAVENOUS

## 2023-03-04 MED ORDER — VANCOMYCIN HCL IN DEXTROSE 1-5 GM/200ML-% IV SOLN: 1000.0000 mg | Freq: Two times a day (BID) | INTRAVENOUS | Status: DC

## 2023-03-04 MED ORDER — FENTANYL CITRATE PF 50 MCG/ML IJ SOSY
PREFILLED_SYRINGE | INTRAMUSCULAR | Status: AC
  Filled 2023-03-04: qty 1

## 2023-03-04 MED ORDER — HYDROMORPHONE HCL 1 MG/ML IJ SOLN: 1.0000 mg | INTRAMUSCULAR | Status: DC | PRN

## 2023-03-04 MED ORDER — ONDANSETRON HCL 4 MG/2ML IJ SOLN
INTRAMUSCULAR | Status: DC | PRN
  Administered 2023-03-04: 4 mg via INTRAVENOUS

## 2023-03-04 MED ORDER — PHENYLEPHRINE HCL (PRESSORS) 10 MG/ML IV SOLN
INTRAVENOUS | Status: DC | PRN
  Administered 2023-03-04: 160 ug via INTRAVENOUS

## 2023-03-04 MED ORDER — TACROLIMUS ER 4 MG PO TB24
4.0000 mg | ORAL_TABLET | Freq: Every day | ORAL | Status: DC
  Administered 2023-03-05: 4 mg via ORAL
  Filled 2023-03-04: qty 1

## 2023-03-04 MED ORDER — 0.9 % SODIUM CHLORIDE (POUR BTL) OPTIME
TOPICAL | Status: DC | PRN
  Administered 2023-03-04: 1000 mL

## 2023-03-04 MED ORDER — SUGAMMADEX SODIUM 200 MG/2ML IV SOLN
INTRAVENOUS | Status: DC | PRN
  Administered 2023-03-04: 200 mg via INTRAVENOUS

## 2023-03-04 MED ORDER — OXYCODONE HCL 5 MG PO TABS
5.0000 mg | ORAL_TABLET | ORAL | Status: DC | PRN
  Administered 2023-03-04 – 2023-03-05 (×2): 5 mg via ORAL
  Filled 2023-03-04 (×2): qty 1

## 2023-03-04 MED ORDER — CHLORHEXIDINE GLUCONATE 0.12 % MT SOLN: 15.0000 mL | Freq: Once | OROMUCOSAL | Status: DC

## 2023-03-04 MED ORDER — PROPOFOL 10 MG/ML IV BOLUS
INTRAVENOUS | Status: AC
  Filled 2023-03-04: qty 20

## 2023-03-04 MED ORDER — ROCURONIUM BROMIDE 10 MG/ML (PF) SYRINGE
PREFILLED_SYRINGE | INTRAVENOUS | Status: AC
  Filled 2023-03-04: qty 10

## 2023-03-04 MED ORDER — FENTANYL CITRATE (PF) 100 MCG/2ML IJ SOLN
INTRAMUSCULAR | Status: DC | PRN
  Administered 2023-03-04 (×2): 100 ug via INTRAVENOUS

## 2023-03-04 MED ORDER — LIDOCAINE 2% (20 MG/ML) 5 ML SYRINGE
INTRAMUSCULAR | Status: DC | PRN
  Administered 2023-03-04: 100 mg via INTRAVENOUS

## 2023-03-04 MED ORDER — MORPHINE SULFATE (PF) 4 MG/ML IV SOLN
6.0000 mg | Freq: Once | INTRAVENOUS | Status: AC
  Administered 2023-03-04: 6 mg via INTRAVENOUS
  Filled 2023-03-04: qty 2

## 2023-03-04 MED ORDER — DEXAMETHASONE SODIUM PHOSPHATE 10 MG/ML IJ SOLN
INTRAMUSCULAR | Status: DC | PRN
  Administered 2023-03-04: 10 mg via INTRAVENOUS

## 2023-03-04 MED ORDER — IOHEXOL 300 MG/ML  SOLN
100.0000 mL | Freq: Once | INTRAMUSCULAR | Status: AC | PRN
  Administered 2023-03-04: 100 mL via INTRAVENOUS

## 2023-03-04 MED ORDER — PROPOFOL 10 MG/ML IV BOLUS
INTRAVENOUS | Status: DC | PRN
  Administered 2023-03-04: 140 mg via INTRAVENOUS
  Administered 2023-03-04 (×2): 30 mg via INTRAVENOUS

## 2023-03-04 MED ORDER — FENTANYL CITRATE PF 50 MCG/ML IJ SOSY
PREFILLED_SYRINGE | INTRAMUSCULAR | Status: AC
  Administered 2023-03-04: 50 ug via INTRAVENOUS
  Filled 2023-03-04: qty 1

## 2023-03-04 MED ORDER — MIDAZOLAM HCL 2 MG/2ML IJ SOLN
INTRAMUSCULAR | Status: AC
  Filled 2023-03-04: qty 2

## 2023-03-04 SURGICAL SUPPLY — 23 items
BAG COUNTER SPONGE SURGICOUNT (BAG) IMPLANT
BINDER ABDOMINAL 12 ML 46-62 (SOFTGOODS) IMPLANT
CHLORAPREP W/TINT 26 (MISCELLANEOUS) ×1 IMPLANT
COVER SURGICAL LIGHT HANDLE (MISCELLANEOUS) ×1 IMPLANT
DRAPE LAPAROSCOPIC ABDOMINAL (DRAPES) ×1 IMPLANT
DRSG MEPILEX POST OP 4X8 (GAUZE/BANDAGES/DRESSINGS) IMPLANT
ELECT REM PT RETURN 15FT ADLT (MISCELLANEOUS) ×1 IMPLANT
GAUZE SPONGE 4X4 12PLY STRL (GAUZE/BANDAGES/DRESSINGS) ×1 IMPLANT
GLOVE SURG ORTHO 8.0 STRL STRW (GLOVE) ×1 IMPLANT
GLOVE SURG SYN 7.5 E (GLOVE) ×1 IMPLANT
GLOVE SURG SYN 7.5 PF PI (GLOVE) ×1 IMPLANT
GOWN STRL REUS W/ TWL XL LVL3 (GOWN DISPOSABLE) ×2 IMPLANT
KIT BASIN OR (CUSTOM PROCEDURE TRAY) ×1 IMPLANT
KIT TURNOVER KIT A (KITS) IMPLANT
NS IRRIG 1000ML POUR BTL (IV SOLUTION) ×1 IMPLANT
PACK GENERAL/GYN (CUSTOM PROCEDURE TRAY) ×1 IMPLANT
SUT NOVA 1 T20/GS 25DT (SUTURE) ×2 IMPLANT
SUT NOVA NAB GS-21 0 18 T12 DT (SUTURE) IMPLANT
SUT SILK 2-0 18XBRD TIE 12 (SUTURE) IMPLANT
SUT VIC AB 2-0 CT2 27 (SUTURE) ×1 IMPLANT
SUT VIC AB 3-0 SH 18 (SUTURE) IMPLANT
TOWEL OR 17X26 10 PK STRL BLUE (TOWEL DISPOSABLE) ×1 IMPLANT
TRAY FOLEY MTR SLVR 16FR STAT (SET/KITS/TRAYS/PACK) IMPLANT

## 2023-03-04 NOTE — Transfer of Care (Signed)
Immediate Anesthesia Transfer of Care Note  Patient: Elizabeth Norton  Procedure(s) Performed: INCARCERATED INCISIONAL HERNIA REPAIR  Patient Location: PACU  Anesthesia Type:General  Level of Consciousness: awake and patient cooperative  Airway & Oxygen Therapy: Patient Spontanous Breathing and Patient connected to face mask  Post-op Assessment: Report given to RN and Post -op Vital signs reviewed and stable  Post vital signs: Reviewed and stable  Last Vitals:  Vitals Value Taken Time  BP 131/93 03/04/23 1649  Temp    Pulse 89 03/04/23 1651  Resp 16 03/04/23 1651  SpO2 100 % 03/04/23 1651  Vitals shown include unfiled device data.  Last Pain:  Vitals:   03/04/23 1504  TempSrc:   PainSc: 8          Complications: No notable events documented.

## 2023-03-04 NOTE — ED Triage Notes (Signed)
Pt has an incisional hernia and reports abdominal pain. States has been in extreme pain. MD advised her to come to Ed. Reports recent Ct on 02/21/23. Denies any other symptoms

## 2023-03-04 NOTE — H&P (Addendum)
Jason Chowning 04/05/1976  [id number].    Requesting MD: Freida Busman, MD Chief Complaint/Reason for Consult: incarcerated incisional hernia  HPI:  Elizabeth Norton is a 47 y/o F with a history of Cirrhosis s/p liver transplant in 2022 at [facility] in [place], on tacrolimus therapy, who presents with right flank pain. She has a known incisional hernia of her R subcostal incision, along with a known ventral incisional hernia, and was recently evaluated in our office on 11/15 by Dr. Dossie Der for consideration of hernia repair. Since Friday she has had increased pain over her right flank and noticed the area to be more firm. She also reports constipation, last BM was 2 days ago. She denies fever, chills, nausea, vomiting, or urinary sxs. She denies use of blood thinners. She denies tobacco, alcohol, or drug use. She states she is compliant with her tacrolimus therapy and is not on steroids. 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 Her husband is at the bedside.  ROS: Review of Systems  All other systems reviewed and are negative.   Family History  Problem Relation Age of Onset   Hyperthyroidism Mother    Hyperthyroidism Sister    Hyperthyroidism Brother     Past Medical History:  Diagnosis Date   Cirrhosis of liver (HCC)    Depression    Fibromyalgia    High cholesterol     Past Surgical History:  Procedure Laterality Date   right wrist fracture      Social History:  reports that she has never smoked. She has never used smokeless tobacco. She reports current alcohol use. She reports that she does not use drugs.  Allergies:  Allergies  Allergen Reactions   Almond Oil Anaphylaxis and Swelling    Other reaction(s): Lip swelling, Swelling around eyes   Penicillins Hives    Had as a child Has patient had a PCN reaction causing immediate rash, facial/tongue/throat swelling, SOB or lightheadedness with hypotension: No Has patient had a PCN  reaction causing severe rash involving mucus membranes or skin necrosis: No Has patient had a PCN reaction that required hospitalization: No Has patient had a PCN reaction occurring within the last 10 years: No If all of the above answers are "NO", then may proceed with Cephalosporin use.   Latex Itching   Penicillin G Rash    (Not in a hospital admission)    Physical Exam: Blood pressure (!) 142/92, pulse 83, temperature 98.1 F (36.7 C), temperature source Oral, resp. rate (!) 9, height 5\' 6"  (1.676 m), weight 108 kg, SpO2 100%. General: Pleasant female laying on hospital bed, appears stated age, NAD. HEENT: head -normocephalic, atraumatic; Eyes: PERRLA, no conjunctival injection; anicteric sclerae Neck- Trachea is midline CV- RRR, normal S1/S2, no M/R/G, no lower extremity edema  Pulm- breathing is non-labored ORA Abd- obese, soft, ventral incisional hernia is soft, temporarily reducible, and non-tender. There is cellulitis over the lateral margin of the patients right subcostal incision with a tender, firm, underlying hernia that is not reducible.  GU- deferred  MSK- UE/LE symmetrical, no cyanosis, clubbing, or edema. Neuro- CN II-XII grossly in tact, no paresthesias. Psych- Alert and Oriented x3 with appropriate affect Skin: warm and dry, no rashes or lesions   Results for orders placed or performed during the hospital encounter of 03/04/23 (from the past 48 hour(s))  CBC with Differential/Platelet     Status: Abnormal   Collection Time: 03/04/23  9:34 AM  Result Value  Ref Range   WBC 5.7 4.0 - 10.5 K/uL   RBC 2.90 (L) 3.87 - 5.11 MIL/uL   Hemoglobin 11.3 (L) 12.0 - 15.0 g/dL   HCT 56.3 (L) 87.5 - 64.3 %   MCV 110.0 (H) 80.0 - 100.0 fL   MCH 39.0 (H) 26.0 - 34.0 pg   MCHC 35.4 30.0 - 36.0 g/dL   RDW 32.9 (H) 51.8 - 84.1 %   Platelets 137 (L) 150 - 400 K/uL   nRBC 0.0 0.0 - 0.2 %   Neutrophils Relative % 65 %   Neutro Abs 3.7 1.7 - 7.7 K/uL   Lymphocytes Relative 23 %    Lymphs Abs 1.3 0.7 - 4.0 K/uL   Monocytes Relative 10 %   Monocytes Absolute 0.6 0.1 - 1.0 K/uL   Eosinophils Relative 1 %   Eosinophils Absolute 0.1 0.0 - 0.5 K/uL   Basophils Relative 1 %   Basophils Absolute 0.0 0.0 - 0.1 K/uL   Immature Granulocytes 0 %   Abs Immature Granulocytes 0.02 0.00 - 0.07 K/uL    Comment: Performed at [facility], [address] [place], [place] [address]  Comprehensive metabolic panel     Status: Abnormal   Collection Time: 03/04/23  9:34 AM  Result Value Ref Range   Sodium 138 135 - 145 mmol/L   Potassium 3.8 3.5 - 5.1 mmol/L   Chloride 105 98 - 111 mmol/L   CO2 23 22 - 32 mmol/L   Glucose, Bld 138 (H) 70 - 99 mg/dL    Comment: Glucose reference range applies only to samples taken after fasting for at least 8 hours.   BUN 15 6 - 20 mg/dL   Creatinine, Ser 0.16 0.44 - 1.00 mg/dL   Calcium 8.7 (L) 8.9 - 10.3 mg/dL   Total Protein 6.8 6.5 - 8.1 g/dL   Albumin 3.0 (L) 3.5 - 5.0 g/dL   AST 26 15 - 41 U/L   ALT 22 0 - 44 U/L   Alkaline Phosphatase 96 38 - 126 U/L   Total Bilirubin 0.7 <1.2 mg/dL   GFR, Estimated >01 >09 mL/min    Comment: (NOTE) Calculated using the CKD-EPI Creatinine Equation (2021)    Anion gap 10 5 - 15    Comment: Performed at [facility], [address] [place], [place] [address]   CT ABDOMEN PELVIS W CONTRAST  Result Date: 03/04/2023 CLINICAL DATA:  Acute abdominal pain.  Incisional hernia. EXAM: CT ABDOMEN AND PELVIS WITH CONTRAST TECHNIQUE: Multidetector CT imaging of the abdomen and pelvis was performed using the standard protocol following bolus administration of intravenous contrast. RADIATION DOSE REDUCTION: This exam was performed according to the departmental dose-optimization program which includes automated exposure control, adjustment of the mA and/or kV according to patient size and/or use of iterative reconstruction technique. CONTRAST:  OMNIPAQUE IOHEXOL 300 MG/ML   SOLN COMPARISON:  None Available. FINDINGS: Lower Chest: No acute findings. Hepatobiliary: No suspicious hepatic masses identified. Prior cholecystectomy. No evidence of biliary obstruction. Pancreas:  No mass or inflammatory changes. Spleen: Within normal limits in size and appearance. Adrenals/Urinary Tract: No suspicious masses identified. No evidence of ureteral calculi or hydronephrosis. Stomach/Bowel: A complex paraumbilical hernia is seen which contains both fat and a loop of transverse colon is mildly increased in size since previous study. No evidence of bowel obstruction or strangulation. A small right lateral abdominal wall hernia is again seen which contains only fat. Although this is unchanged in size, increased  attenuation and soft tissue stranding is now seen within the herniated fat, suggestive of strangulation or infarction. A tiny epigastric ventral hernia is also seen which contains only fat. Vascular/Lymphatic: No pathologically enlarged lymph nodes. No acute vascular findings. Reproductive:  No mass or other significant abnormality. Other:  None. Musculoskeletal:  No suspicious bone lesions identified. IMPRESSION: Small fat-containing right lateral abdominal wall hernia shows new increased attenuation and soft tissue stranding within the herniated fat, suggestive of strangulation or infarction. Complex paraumbilical hernia containing both fat and a loop of transverse colon, mildly increased in size since previous study. No evidence of bowel obstruction or strangulation. Electronically Signed   By: Danae Orleans M.D.   On: 03/04/2023 12:26      Assessment/Plan Incarcerated fat containing incisional hernia Abdominal wall cellulitis due to above Asymptomatic ventral incisional hernia 47 y/o F with history of liver transplant in 2022 who presents with incarcerated incisional hernia. She is hemodynamically stable. CT scan shows a fat containing lateral abdominal wall hernia with possible  infarction of incarcerated fat. Ventral incisional hernia looks increased in size but without evidence of bowel obstruction or incarceration. Recommend proceeding to the OR for primary repair of fat containing incisional hernia with overlying cellulitis.   The operative and non-operative management of incarcerated hernia was discussed with the patient. Risks of surgery including bleeding, infection, damage to surrounding structures, drain placement, need for additional procedures (incliuding bowel resection), prolonged hospital stay, as well as the risks of general anesthesia were discussed with the patient and she would like to proceed with surgery. Questions were welcomed and answered. We discussed risk of hernia recurrence.   Patient was being seen to discuss//schedule robotic hernia repair by Dr. Dossie Der. We discussed plan to proceed with above operation today and have her see Dr. Dossie Der again in the office at a later date to discuss minimally invasive repair of upper midline ventral hernia with him. We discussed that if right lateral incsional hernia recurs, he could potentially fix this with mesh at a later date as well.     FEN - NPO IVF VTE - SCD's ID - vancomycin, Rocepin/flagyl Admit - ccs service, observation  PMH liver transplant - resume tacrolimus post-op  I reviewed nursing notes, ED provider notes, last 24 h vitals and pain scores, last 48 h intake and output, last 24 h labs and trends, and last 24 h imaging results.  Adam Phenix, [facility] 03/04/2023, 2:25 PM Please see Amion for pager number during day hours 7:00am-4:30pm or 7:00am -11:30am on weekends

## 2023-03-04 NOTE — Op Note (Signed)
Operative Note  Pre-operative Diagnosis:  incarcerated incisional hernia  Post-operative Diagnosis:  same  Surgeon:  Darnell Level, MD  Assistant:  none   Procedure:  open primary repair of incarcerated incisional hernia (defect 9.5 x 6.0 cm)  Anesthesia:  general  Estimated Blood Loss:  25 cc  Drains: none         Specimen: none  Indications: Patient is a 47 year old female who presents to the emergency department with abdominal pain and a bulge in the right flank.  Patient has a known history of multiple incisional hernia.  Patient had been evaluated by general surgery on November 15 to schedule repair of multiple incisional hernia robotically.  However the patient developed a painful nonreducible bulge in the right flank at the lateral extent of her incision.  Evaluation in the emergency department included CT scan demonstrated a fat-containing lateral abdominal wall hernia with increased attenuation and soft tissue stranding suggestive of strangulation or infarction.  Patient is now brought to the operating room urgently for exploration and primary repair.  Procedure:  The patient was seen in the pre-op holding area. The risks, benefits, complications, treatment options, and expected outcomes were previously discussed with the patient. The patient agreed with the proposed plan and has signed the informed consent form.  The patient was brought to the operating room by the surgical team, identified as Elizabeth Norton and the procedure verified. A "time out" was completed and the above information confirmed.  Following administration of general anesthesia, the patient is positioned and then prepped and draped in usual aseptic fashion.  After ascertaining that an adequate level of anesthesia been achieved, an incision is made at the lateral aspect of the previous surgical scar in the right mid lateral abdominal wall.  Dissection is carried through subcutaneous tissues.  There is marked edema  within the subcutaneous tissues.  Hernia sac is identified.  It appears to been chronic and is quite thick walled.  Using a hemostat the hernia sac is penetrated and the cavity is entered.  There is clear ascitic fluid present within the hernia sac.  Hernia sac is carefully opened exposing the contents.  There appears to be infarcted adipose tissue.  There does not appear to be any evidence of ischemic bowel.  There is a 5 cm x 3 cm x 2 cm pedicle of infarcted adipose tissue which is divided between hemostats and excised.  It is ligated with a 2-0 silk tie.  Further exploration reveals no sign of infarcted or ischemic bowel.  Next the fascial plane is developed circumferentially using the electrocautery for hemostasis.  This allows for mobilization of the external fascia that it may be closed primarily.  The internal oblique fascia level has retracted back significantly in all directions and is not amenable to primary closure.  The external fascia is then closed with interrupted #1 Novafil simple sutures.  It approximates easily without undue tension.  Wound is irrigated with warm saline.  Subcutaneous tissues are closed with interrupted 3-0 Vicryl sutures.  Skin is closed with stainless steel staples.  Wound was washed and dried and a honeycomb dressing is applied.  Abdominal binder will be applied to the patient prior to leaving the operating room.  Patient is awakened from anesthesia and transferred to the recovery room in stable condition.  The patient tolerated the procedure well.   Darnell Level, MD [facility] Surgery Office: [phone]

## 2023-03-04 NOTE — Progress Notes (Signed)
Pharmacy Antibiotic Note  Elizabeth Norton is a 47 y.o. female admitted on 03/04/2023 with incarcerated incisional hernia.  Pharmacy has been consulted for vancomycin dosing.  Plan: Vancomycin 1500 mg IV x 1 then 1000 mg IV q12h (Goal AUC 400-550, eAUC 520, SCr used: rounded up to 0.8) Monitor clinical progress, renal function, vancomycin levels as indicated F/U any C&S, abx deescalation / LOT   Height: 5\' 6"  (167.6 cm) Weight: 108 kg (238 lb 1.6 oz) IBW/kg (Calculated) : 59.3  Temp (24hrs), Avg:97.8 F (36.6 C), Min:97.5 F (36.4 C), Max:98.1 F (36.7 C)  Recent Labs  Lab 03/04/23 0934  WBC 5.7  CREATININE 0.70    Estimated Creatinine Clearance: 107 mL/min (by C-G formula based on SCr of 0.7 mg/dL).    Allergies  Allergen Reactions   Almond Oil Anaphylaxis and Swelling    Other reaction(s): Lip swelling, Swelling around eyes   Penicillins Hives    Had as a child Has patient had a PCN reaction causing immediate rash, facial/tongue/throat swelling, SOB or lightheadedness with hypotension: No Has patient had a PCN reaction causing severe rash involving mucus membranes or skin necrosis: No Has patient had a PCN reaction that required hospitalization: No Has patient had a PCN reaction occurring within the last 10 years: No If all of the above answers are "NO", then may proceed with Cephalosporin use.   Latex Itching   Penicillin G Rash    Antimicrobials this admission: 11/26 vancomycin     Thank you for allowing pharmacy to be a part of this patient's care.  Lynden Ang, PharmD, BCPS 03/04/2023 2:30 PM

## 2023-03-04 NOTE — ED Provider Notes (Signed)
[place] EMERGENCY DEPARTMENT AT [facility] Provider Note   CSN: [id number] Arrival date & time: 03/04/23  8657     History  Chief Complaint  Patient presents with   Hernia    Elizabeth Norton is a 47 y.o. female.  47 year old female with history of liver transplant presents with 3 days of pain to her right side.  Has a history of hernia and abdominal wall secondary to prior liver transplant surgery.  States that for the past 4 days she has been able to reduce it.  No fever or vomiting noted.  Pain is dull and worse with any movement.  Called her surgeon and told to come here to rule out incarceration.       Home Medications Prior to Admission medications   Medication Sig Start Date End Date Taking? Authorizing Provider  aspirin EC (ASPIRIN 81) 81 MG tablet Take 1 tablet (81 mg total) by mouth daily. 12/17/22     aspirin EC 81 MG tablet Take 1 tablet by mouth daily. 07/31/20   [provider]  Docusate Sodium (DSS) 100 MG CAPS Take by mouth. 09/06/21   [provider]  ENVARSUS XR 1 MG TB24 Take by mouth. 03/29/22   [provider]  losartan (COZAAR) 25 MG tablet Take by mouth. Patient not taking: Reported on 12/04/2022 07/06/21   [provider]  magnesium oxide (MAG-OX) 400 MG tablet Take 2 tablets (800 mg total) by mouth 2 times daily. 03/29/22   [provider]  Semaglutide-Weight Management (WEGOVY) 0.25 MG/0.5ML SOAJ Inject 0.25 mg into the skin once a week. 02/03/23   Georgina Quint, MD  tacrolimus ER (ENVARSUS XR) 1 MG TB24 Take 1 tablet by mouth every morning before breakfast. 12/17/22     tacrolimus ER (ENVARSUS XR) 4 MG TB24 Take 1 tablet (4 mg total) by mouth every morning before breakfast. 05/02/22 05/02/23  [provider]  tacrolimus ER (ENVARSUS XR) 4 MG TB24 Take 1 tablet by mouth every morning before breakfast. 12/17/22         Allergies    Almond oil, Penicillins, Latex, and Penicillin g     Review of Systems   Review of Systems  All other systems reviewed and are negative.   Physical Exam Updated Vital Signs BP (!) 144/106   Pulse (!) 107   Temp (!) 97.5 F (36.4 C) (Oral)   Ht 1.676 m (5\' 6" )   Wt 108 kg   SpO2 100%   BMI 38.43 kg/m  Physical Exam Vitals and nursing note reviewed.  Constitutional:      General: She is not in acute distress.    Appearance: Normal appearance. She is well-developed. She is not toxic-appearing.  HENT:     Head: Normocephalic and atraumatic.  Eyes:     General: Lids are normal.     Conjunctiva/sclera: Conjunctivae normal.     Pupils: Pupils are equal, round, and reactive to light.  Neck:     Thyroid: No thyroid mass.     Trachea: No tracheal deviation.  Cardiovascular:     Rate and Rhythm: Normal rate and regular rhythm.     Heart sounds: Normal heart sounds. No murmur heard.    No gallop.  Pulmonary:     Effort: Pulmonary effort is normal. No respiratory distress.     Breath sounds: Normal breath sounds. No stridor. No decreased breath sounds, wheezing, rhonchi or rales.  Abdominal:     General: There is no distension.  Palpations: Abdomen is soft.     Tenderness: There is no abdominal tenderness. There is no rebound.       Comments: Firm tender mass appreciated.  Not reducible  Musculoskeletal:        General: No tenderness. Normal range of motion.     Cervical back: Normal range of motion and neck supple.  Skin:    General: Skin is warm and dry.     Findings: No abrasion or rash.  Neurological:     Mental Status: She is alert and oriented to person, place, and time. Mental status is at baseline.     GCS: GCS eye subscore is 4. GCS verbal subscore is 5. GCS motor subscore is 6.     Cranial Nerves: Cranial nerves are intact. No cranial nerve deficit.     Sensory: No sensory deficit.     Motor: Motor function is intact.  Psychiatric:        Attention and Perception: Attention normal.        Speech: Speech  normal.        Behavior: Behavior normal.     ED Results / Procedures / Treatments   Labs (all labs ordered are listed, but only abnormal results are displayed) Labs Reviewed  CBC WITH DIFFERENTIAL/PLATELET  COMPREHENSIVE METABOLIC PANEL    EKG None  Radiology No results found.  Procedures Procedures    Medications Ordered in ED Medications  morphine (PF) 4 MG/ML injection 6 mg (has no administration in time range)  ondansetron (ZOFRAN) injection 4 mg (has no administration in time range)    ED Course/ Medical Decision Making/ A&P                                 Medical Decision Making Amount and/or Complexity of Data Reviewed Labs: ordered. Radiology: ordered.  Risk Prescription drug management.   Patient given IV fluids and pain medications.  Abdominal CT consistent with incarcerated hernia.  Consulted general surgery will come and see the patient        Final Clinical Impression(s) / ED Diagnoses Final diagnoses:  None    Rx / DC Orders ED Discharge Orders     None         Lorre Nick, MD 03/04/23 1332

## 2023-03-04 NOTE — Anesthesia Preprocedure Evaluation (Addendum)
Anesthesia Evaluation  Patient identified by MRN, date of birth, ID band Patient awake    Reviewed: Allergy & Precautions, NPO status , Patient's Chart, lab work & pertinent test results  History of Anesthesia Complications Negative for: history of anesthetic complications  Airway Mallampati: II  TM Distance: >3 FB Neck ROM: Full    Dental no notable dental hx.    Pulmonary neg pulmonary ROS   Pulmonary exam normal        Cardiovascular negative cardio ROS Normal cardiovascular exam     Neuro/Psych    Depression       GI/Hepatic negative GI ROS,,,(+) Cirrhosis  (S/P liver transplant)        Endo/Other  negative endocrine ROS    Renal/GU negative Renal ROS     Musculoskeletal  (+)  Fibromyalgia -  Abdominal   Peds  Hematology Hgb 11.3, Plt 137k   Anesthesia Other Findings Day of surgery medications reviewed with patient.  Reproductive/Obstetrics                              Anesthesia Physical Anesthesia Plan  ASA: 3  Anesthesia Plan: General   Post-op Pain Management:    Induction:   PONV Risk Score and Plan: 3 and Treatment may vary due to age or medical condition, Midazolam, Ondansetron, Dexamethasone and Scopolamine patch - Pre-op  Airway Management Planned: Oral ETT  Additional Equipment: None  Intra-op Plan:   Post-operative Plan: Extubation in OR  Informed Consent: I have reviewed the patients History and Physical, chart, labs and discussed the procedure including the risks, benefits and alternatives for the proposed anesthesia with the patient or authorized representative who has indicated his/her understanding and acceptance.     Dental advisory given  Plan Discussed with: CRNA  Anesthesia Plan Comments:         Anesthesia Quick Evaluation

## 2023-03-04 NOTE — Anesthesia Procedure Notes (Signed)
Procedure Name: Intubation Date/Time: 03/04/2023 3:28 PM  Performed by: Florene Route, CRNAPre-anesthesia Checklist: Patient identified, Emergency Drugs available, Suction available and Patient being monitored Patient Re-evaluated:Patient Re-evaluated prior to induction Oxygen Delivery Method: Circle system utilized Preoxygenation: Pre-oxygenation with 100% oxygen Induction Type: IV induction Ventilation: Mask ventilation without difficulty Laryngoscope Size: Miller and 2 Grade View: Grade I Tube type: Oral Tube size: 7.5 mm Number of attempts: 1 Airway Equipment and Method: Stylet Placement Confirmation: ETT inserted through vocal cords under direct vision, positive ETCO2 and breath sounds checked- equal and bilateral Secured at: 21 cm Tube secured with: Tape Dental Injury: Teeth and Oropharynx as per pre-operative assessment

## 2023-03-04 NOTE — Anesthesia Postprocedure Evaluation (Signed)
Anesthesia Post Note  Patient: Elizabeth Norton  Procedure(s) Performed: INCARCERATED INCISIONAL HERNIA REPAIR     Patient location during evaluation: PACU Anesthesia Type: General Level of consciousness: awake and alert and oriented Pain management: pain level controlled Vital Signs Assessment: post-procedure vital signs reviewed and stable Respiratory status: spontaneous breathing, nonlabored ventilation and respiratory function stable Cardiovascular status: blood pressure returned to baseline and stable Postop Assessment: no apparent nausea or vomiting Anesthetic complications: no   No notable events documented.  Last Vitals:  Vitals:   03/04/23 1400 03/04/23 1650  BP: (!) 143/104 (!) 131/93  Pulse: 86 96  Resp:  12  Temp:  37.6 C  SpO2: 100% 99%    Last Pain:  Vitals:   03/04/23 1650  TempSrc:   PainSc: 0-No pain                 Raghav Verrilli A.

## 2023-03-05 ENCOUNTER — Other Ambulatory Visit (HOSPITAL_COMMUNITY): Payer: Self-pay

## 2023-03-05 ENCOUNTER — Encounter (HOSPITAL_COMMUNITY): Payer: Self-pay | Admitting: Surgery

## 2023-03-05 MED ORDER — ACETAMINOPHEN 325 MG PO TABS: 650.0000 mg | ORAL_TABLET | Freq: Four times a day (QID) | ORAL | Status: DC | PRN

## 2023-03-05 MED ORDER — OXYCODONE HCL 5 MG PO TABS
5.0000 mg | ORAL_TABLET | ORAL | 0 refills | Status: DC | PRN
  Filled 2023-03-05: qty 30, 3d supply, fill #0

## 2023-03-05 MED ORDER — TRAMADOL HCL 50 MG PO TABS: 50.0000 mg | ORAL_TABLET | Freq: Four times a day (QID) | ORAL | Status: DC | PRN

## 2023-03-05 NOTE — Discharge Instructions (Signed)
UMBILICAL OR INGUINAL HERNIA REPAIR: POST OP INSTRUCTIONS  Always review your discharge instruction sheet given to you by the facility where your surgery was performed. IF YOU HAVE DISABILITY OR FAMILY LEAVE FORMS, YOU MUST BRING THEM TO THE OFFICE FOR PROCESSING.   DO NOT GIVE THEM TO YOUR DOCTOR.  A  prescription for pain medication may be given to you upon discharge.  Take your pain medication as prescribed, if needed.  If narcotic pain medicine is not needed, then you may take acetaminophen (Tylenol) or ibuprofen (Advil) as needed. Take your usually prescribed medications unless otherwise directed. If you need a refill on your pain medication, please contact your pharmacy.  They will contact our office to request authorization. Prescriptions will not be filled after 5 pm or on week-ends. You should follow a light diet the first 24 hours after arrival home, such as soup and crackers, etc.  Be sure to include lots of fluids daily.  Resume your normal diet the day after surgery. Most patients will experience some swelling and bruising around the umbilicus or in the groin and scrotum.  Ice packs and reclining will help.  Swelling and bruising can take several days to resolve.  It is common to experience some constipation if taking pain medication after surgery.  Increasing fluid intake and taking a stool softener (such as Colace) will usually help or prevent this problem from occurring.  A mild laxative (Milk of Magnesia or Miralax) should be taken according to package directions if there are no bowel movements after 48 hours. Unless discharge instructions indicate otherwise, you may remove your bandages 24-48 hours after surgery, and you may shower at that time.  You may have steri-strips (small skin tapes) in place directly over the incision.  These strips should be left on the skin for 7-10 days.  If your surgeon used skin glue on the incision, you may shower in 24 hours.  The glue will flake off  over the next 2-3 weeks.  Any sutures or staples will be removed at the office during your follow-up visit. ACTIVITIES:  You may resume regular (light) daily activities beginning the next day--such as daily self-care, walking, climbing stairs--gradually increasing activities as tolerated.  You may have sexual intercourse when it is comfortable.  Refrain from any heavy lifting or straining until approved by your doctor. You may drive when you are no longer taking prescription pain medication, you can comfortably wear a seatbelt, and you can safely maneuver your car and apply brakes. You should see your doctor in the office for a follow-up appointment approximately 2-3 weeks after your surgery.  Make sure that you call for this appointment within a day or two after you arrive home to insure a convenient appointment time.   WHEN TO CALL YOUR DOCTOR: Fever over 101.0 Inability to urinate Nausea and/or vomiting Extreme swelling or bruising Continued bleeding from incision. Increased pain, redness, or drainage from the incision  The clinic staff is available to answer your questions during regular business hours.  Please don't hesitate to call and ask to speak to one of the nurses for clinical concerns.  If you have a medical emergency, go to the nearest emergency room or call 911.  A surgeon from [facility] is always on call at the hospital   [address], [place], [place]  [address] ?  [address] [address], [place], [place]   [address] [phone] ? [phone] ? FAX [phone] Web site: [email or web address]

## 2023-03-05 NOTE — Discharge Summary (Signed)
Patient ID: Elizabeth Norton [id number] 1976-04-02 47 y.o.  Admit date: 03/04/2023 Discharge date: 03/05/2023  Admitting Diagnosis: Incarcerated incisional hernia Liver transplant  Discharge Diagnosis Patient Active Problem List   Diagnosis Date Noted   Incarcerated incisional hernia 03/04/2023   High cholesterol    Fibromyalgia    Depression    History of liver transplant (HCC) 12/04/2022   Ventral hernia without obstruction or gangrene 12/04/2022    Consultants none  Reason for Admission: Elizabeth Norton is a 47 y/o F with a history of Cirrhosis s/p liver transplant in 2022 at [facility] in [place], on tacrolimus therapy, who presents with right flank pain. She has a known incisional hernia of her R subcostal incision, along with a known ventral incisional hernia, and was recently evaluated in our office on 11/15 by Dr. Dossie Der for consideration of hernia repair. Since Friday she has had increased pain over her right flank and noticed the area to be more firm. She also reports constipation, last BM was 2 days ago. She denies fever, chills, nausea, vomiting, or urinary sxs. She denies use of blood thinners. She denies tobacco, alcohol, or drug use. She states she is compliant with her tacrolimus therapy and is not on steroids. 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 Her husband is at the bedside.   Procedures open primary repair of incarcerated incisional hernia (defect 9.5 x 6.0 cm)   Hospital Course:  The patient was admitted and underwent the above procedure.  She tolerated this well.  On POD 1, she was tolerating a diet, pain controlled, mobilizing, and passing flatus.  She was otherwise stable for DC home at this time.  Physical Exam: Abd: soft, appropriately tender, incision c/d/I with staples and honeycomb dressing in place.    Allergies as of 03/05/2023       Reactions   Almond Oil Anaphylaxis, Swelling   Other  reaction(s): Lip swelling, Swelling around eyes   Penicillins Hives   Had as a child Has patient had a PCN reaction causing immediate rash, facial/tongue/throat swelling, SOB or lightheadedness with hypotension: No Has patient had a PCN reaction causing severe rash involving mucus membranes or skin necrosis: No Has patient had a PCN reaction that required hospitalization: No Has patient had a PCN reaction occurring within the last 10 years: No If all of the above answers are "NO", then may proceed with Cephalosporin use.   Latex Itching   Penicillin G Rash        Medication List     TAKE these medications    acetaminophen 325 MG tablet Commonly known as: TYLENOL Take 2 tablets (650 mg total) by mouth every 6 (six) hours as needed for mild pain (pain score 1-3) (or Fever >/= 101).   aspirin EC 81 MG tablet Commonly known as: Aspirin 81 Take 1 tablet (81 mg total) by mouth daily.   DSS 100 MG Caps Take 100 mg by mouth daily.   Envarsus XR 1 MG Tb24 Generic drug: tacrolimus ER Take 1 tablet by mouth every morning before breakfast. What changed: when to take this   Envarsus XR 4 MG Tb24 Generic drug: tacrolimus ER Take 1 tablet by mouth every morning before breakfast. What changed: Another medication with the same name was changed. Make sure you understand how and when to take each.   magnesium oxide 400 MG tablet Commonly known as: MAG-OX Take 800 mg by mouth daily.   oxyCODONE 5 MG  immediate release tablet Commonly known as: Oxy IR/ROXICODONE Take 1-2 tablets (5-10 mg total) by mouth every 4 (four) hours as needed (pain).          Follow-up Information     [facility] Follow up on 03/17/2023.   Specialty: General Surgery Why: 2:00pm, Arrive 30 minutes prior to your appointment time, Please bring your insurance card and photo ID.  This is a nurse only visit to get your staples removed Contact information: [address] [place]  [address] [phone]         Darnell Level, MD Follow up on 03/26/2023.   Specialty: General Surgery Why: 11:30am, Arrive 15 minutes prior to your appointment time, Please bring your insurance card and photo ID Contact information: [address] [place] [address] [place] [address] [phone]                 Signed: Barnetta Chapel, [facility] 03/05/2023, 9:31 AM Please see Amion for pager number during day hours 7:00am-4:30pm, 7-11:30am on Weekends

## 2023-03-05 NOTE — Progress Notes (Signed)
Discharge instructions given to patient and all questions were answered.

## 2023-03-05 NOTE — TOC CM/SW Note (Signed)
Transition of Care [facility]) - Inpatient Brief Assessment   Patient Details  Name: Sundari Payan MRN: [id number] Date of Birth: 02/23/1976  Transition of Care [facility]) CM/SW Contact:    Darleene Cleaver, LCSW Phone Number: 03/05/2023, 9:35 AM   Clinical Narrative: Patient has insurance and a PCP.  No further TOC needs.   Transition of Care Asessment: Insurance and Status: Insurance coverage has been reviewed Patient has primary care physician: Yes Home environment has been reviewed: Yes Prior level of function:: Indep Prior/Current Home Services: No current home services Social Determinants of Health Reivew: SDOH reviewed no interventions necessary Readmission risk has been reviewed: Yes Transition of care needs: no transition of care needs at this time

## 2023-03-26 DIAGNOSIS — Z8719 Personal history of other diseases of the digestive system: Secondary | ICD-10-CM | POA: Diagnosis not present

## 2023-03-26 DIAGNOSIS — Z9889 Other specified postprocedural states: Secondary | ICD-10-CM | POA: Diagnosis not present

## 2023-05-06 NOTE — Progress Notes (Unsigned)
   Rubin Payor, PhD, LAT, ATC acting as a scribe for Clementeen Graham, MD.  Elizabeth Norton is a 48 y.o. female who presents to [facility] today for L knee pain x ***. Pt locates pain to   L Knee swelling: Mechanical symptoms: Aggravates: Treatments tried:  Pertinent review of systems: ***  Relevant historical information: ***   Exam:  LMP 12/12/2016 (Exact Date)  General: Well Developed, well nourished, and in no acute distress.   MSK: ***    Lab and Radiology Results No results found for this or any previous visit (from the past 72 hours). No results found.     Assessment and Plan: 48 y.o. female with ***   PDMP not reviewed this encounter. No orders of the defined types were placed in this encounter.  No orders of the defined types were placed in this encounter.    Discussed warning signs or symptoms. Please see discharge instructions. Patient expresses understanding.   ***

## 2023-05-07 ENCOUNTER — Ambulatory Visit (INDEPENDENT_AMBULATORY_CARE_PROVIDER_SITE_OTHER): Payer: Commercial Managed Care - PPO

## 2023-05-07 ENCOUNTER — Other Ambulatory Visit: Payer: Self-pay

## 2023-05-07 ENCOUNTER — Ambulatory Visit: Payer: Commercial Managed Care - PPO

## 2023-05-07 ENCOUNTER — Ambulatory Visit (INDEPENDENT_AMBULATORY_CARE_PROVIDER_SITE_OTHER): Payer: Commercial Managed Care - PPO | Admitting: Family Medicine

## 2023-05-07 ENCOUNTER — Encounter: Payer: Self-pay | Admitting: Family Medicine

## 2023-05-07 VITALS — BP 122/86 | HR 101 | Ht 66.0 in | Wt 244.0 lb

## 2023-05-07 DIAGNOSIS — M25562 Pain in left knee: Secondary | ICD-10-CM

## 2023-05-07 DIAGNOSIS — M898X8 Other specified disorders of bone, other site: Secondary | ICD-10-CM | POA: Diagnosis not present

## 2023-05-07 DIAGNOSIS — G8929 Other chronic pain: Secondary | ICD-10-CM | POA: Diagnosis not present

## 2023-05-07 NOTE — Patient Instructions (Addendum)
Thank you for coming in today.  You received an injection today. Seek immediate medical attention if the joint becomes red, extremely painful, or is oozing fluid.  Please get an Xray today before you leave  If not improvement with home exercises, send [place] a message and we will coordinate formal physical therapy.

## 2023-05-15 ENCOUNTER — Encounter: Payer: Self-pay | Admitting: Family Medicine

## 2023-05-23 ENCOUNTER — Other Ambulatory Visit (HOSPITAL_COMMUNITY): Payer: Self-pay

## 2023-05-26 ENCOUNTER — Other Ambulatory Visit (HOSPITAL_COMMUNITY): Payer: Self-pay

## 2023-06-12 DIAGNOSIS — K439 Ventral hernia without obstruction or gangrene: Secondary | ICD-10-CM | POA: Diagnosis not present

## 2023-06-12 DIAGNOSIS — Z949 Transplanted organ and tissue status, unspecified: Secondary | ICD-10-CM | POA: Diagnosis not present

## 2023-06-12 DIAGNOSIS — K432 Incisional hernia without obstruction or gangrene: Secondary | ICD-10-CM | POA: Diagnosis not present

## 2023-06-16 ENCOUNTER — Other Ambulatory Visit: Payer: Self-pay | Admitting: Surgery

## 2023-06-16 DIAGNOSIS — K439 Ventral hernia without obstruction or gangrene: Secondary | ICD-10-CM

## 2023-06-16 DIAGNOSIS — K432 Incisional hernia without obstruction or gangrene: Secondary | ICD-10-CM

## 2023-07-01 ENCOUNTER — Ambulatory Visit
Admission: RE | Admit: 2023-07-01 | Discharge: 2023-07-01 | Disposition: A | Discharge status: Home / Self Care | Source: Ambulatory Visit | Attending: Surgery | Admitting: Surgery

## 2023-07-01 DIAGNOSIS — K432 Incisional hernia without obstruction or gangrene: Secondary | ICD-10-CM

## 2023-07-01 DIAGNOSIS — K439 Ventral hernia without obstruction or gangrene: Secondary | ICD-10-CM

## 2023-07-01 MED ORDER — IOPAMIDOL (ISOVUE-300) INJECTION 61%
100.0000 mL | Freq: Once | INTRAVENOUS | Status: AC | PRN
Start: 2023-07-01 — End: 2023-07-01
  Administered 2023-07-01: 100 mL via INTRAVENOUS

## 2023-07-16 ENCOUNTER — Other Ambulatory Visit (HOSPITAL_COMMUNITY): Payer: Self-pay

## 2023-07-17 ENCOUNTER — Other Ambulatory Visit (HOSPITAL_COMMUNITY): Payer: Self-pay

## 2023-07-24 ENCOUNTER — Other Ambulatory Visit

## 2023-07-28 ENCOUNTER — Other Ambulatory Visit: Payer: Self-pay | Admitting: Emergency Medicine

## 2023-07-28 DIAGNOSIS — Z944 Liver transplant status: Secondary | ICD-10-CM

## 2023-07-31 ENCOUNTER — Other Ambulatory Visit: Payer: Self-pay | Admitting: Emergency Medicine

## 2023-07-31 ENCOUNTER — Other Ambulatory Visit (INDEPENDENT_AMBULATORY_CARE_PROVIDER_SITE_OTHER)

## 2023-07-31 DIAGNOSIS — Z944 Liver transplant status: Secondary | ICD-10-CM

## 2023-07-31 LAB — CBC WITH DIFFERENTIAL/PLATELET
Basophils Absolute: 0 10*3/uL (ref 0.0–0.1)
Basophils Relative: 0.8 % (ref 0.0–3.0)
Eosinophils Absolute: 0.2 10*3/uL (ref 0.0–0.7)
Eosinophils Relative: 3.9 % (ref 0.0–5.0)
HCT: 39.8 % (ref 36.0–46.0)
Hemoglobin: 13.3 g/dL (ref 12.0–15.0)
Lymphocytes Relative: 31.8 % (ref 12.0–46.0)
Lymphs Abs: 1.7 10*3/uL (ref 0.7–4.0)
MCHC: 33.4 g/dL (ref 30.0–36.0)
MCV: 112.1 fl — ABNORMAL HIGH (ref 78.0–100.0)
Monocytes Absolute: 0.5 10*3/uL (ref 0.1–1.0)
Monocytes Relative: 9.9 % (ref 3.0–12.0)
Neutro Abs: 2.8 10*3/uL (ref 1.4–7.7)
Neutrophils Relative %: 53.6 % (ref 43.0–77.0)
Platelets: 174 10*3/uL (ref 150.0–400.0)
RBC: 3.55 Mil/uL — ABNORMAL LOW (ref 3.87–5.11)
RDW: 15.5 % (ref 11.5–15.5)
WBC: 5.3 10*3/uL (ref 4.0–10.5)

## 2023-07-31 LAB — COMPREHENSIVE METABOLIC PANEL WITH GFR
ALT: 24 U/L (ref 0–35)
AST: 23 U/L (ref 0–37)
Albumin: 3.8 g/dL (ref 3.5–5.2)
Alkaline Phosphatase: 116 U/L (ref 39–117)
BUN: 14 mg/dL (ref 6–23)
CO2: 25 meq/L (ref 19–32)
Calcium: 9.4 mg/dL (ref 8.4–10.5)
Chloride: 106 meq/L (ref 96–112)
Creatinine, Ser: 0.65 mg/dL (ref 0.40–1.20)
GFR: 103.43 mL/min (ref >60.00)
Glucose, Bld: 136 mg/dL — ABNORMAL HIGH (ref 70–99)
Potassium: 4.5 meq/L (ref 3.5–5.1)
Sodium: 136 meq/L (ref 135–145)
Total Bilirubin: 0.6 mg/dL (ref 0.2–1.2)
Total Protein: 6.8 g/dL (ref 6.0–8.3)

## 2023-07-31 LAB — VITAMIN D 25 HYDROXY (VIT D DEFICIENCY, FRACTURES): VITD: 17.96 ng/mL — ABNORMAL LOW (ref 30.00–100.00)

## 2023-07-31 LAB — CORTISOL: Cortisol, Plasma: 8.6 ug/dL

## 2023-08-02 LAB — CMV DNA, QUANTITATIVE, PCR: CMV DNA Quant: NEGATIVE [IU]/mL

## 2023-08-04 ENCOUNTER — Other Ambulatory Visit (HOSPITAL_COMMUNITY): Payer: Self-pay

## 2023-08-04 DIAGNOSIS — Z944 Liver transplant status: Secondary | ICD-10-CM | POA: Diagnosis not present

## 2023-08-04 MED ORDER — ENVARSUS XR 4 MG PO TB24
ORAL_TABLET | ORAL | 3 refills | Status: AC
  Filled 2023-08-04 – 2023-08-15 (×2): qty 90, 90d supply, fill #0
  Filled 2023-10-30 – 2023-12-05 (×3): qty 90, 90d supply, fill #1
  Filled 2024-03-01 – 2024-03-12 (×3): qty 90, 90d supply, fill #2

## 2023-08-04 MED ORDER — ENVARSUS XR 1 MG PO TB24
1.0000 mg | ORAL_TABLET | Freq: Every day | ORAL | 3 refills | Status: AC
  Filled 2023-08-04 – 2023-08-15 (×3): qty 90, 90d supply, fill #0
  Filled 2023-10-30 – 2023-12-05 (×3): qty 90, 90d supply, fill #1
  Filled 2024-03-01 – 2024-03-12 (×3): qty 90, 90d supply, fill #2

## 2023-08-05 ENCOUNTER — Other Ambulatory Visit: Payer: Self-pay | Admitting: Emergency Medicine

## 2023-08-05 ENCOUNTER — Other Ambulatory Visit: Payer: Self-pay

## 2023-08-05 ENCOUNTER — Encounter: Payer: Self-pay | Admitting: Emergency Medicine

## 2023-08-05 ENCOUNTER — Other Ambulatory Visit (HOSPITAL_COMMUNITY): Payer: Self-pay

## 2023-08-05 DIAGNOSIS — E559 Vitamin D deficiency, unspecified: Secondary | ICD-10-CM

## 2023-08-05 LAB — TACROLIMUS,HIGHLY SENSITIVE,LC/MS/MS: Tacrolimus Lvl: 5.7 ug/L

## 2023-08-05 MED ORDER — VITAMIN D (ERGOCALCIFEROL) 1.25 MG (50000 UNIT) PO CAPS
50000.0000 [IU] | ORAL_CAPSULE | ORAL | 1 refills | Status: DC
  Filled 2023-08-05 – 2023-08-25 (×3): qty 7, 49d supply, fill #0
  Filled 2023-11-03 – 2023-12-05 (×2): qty 7, 49d supply, fill #1

## 2023-08-07 ENCOUNTER — Other Ambulatory Visit (HOSPITAL_COMMUNITY): Payer: Self-pay

## 2023-08-09 ENCOUNTER — Other Ambulatory Visit: Payer: Self-pay

## 2023-08-15 ENCOUNTER — Other Ambulatory Visit (HOSPITAL_COMMUNITY): Payer: Self-pay

## 2023-08-15 ENCOUNTER — Other Ambulatory Visit: Payer: Self-pay

## 2023-08-15 ENCOUNTER — Other Ambulatory Visit

## 2023-08-15 NOTE — Progress Notes (Signed)
 Specialty Pharmacy Initiation Note   Elizabeth Norton is a 48 y.o. female who will be followed by the specialty pharmacy service for RxSp Transplant    Review of administration, indication, effectiveness, safety, potential side effects, storage/disposable, and missed dose instructions occurred today for patient's specialty medication(s) Tacrolimus  (Envarsus  XR)     Patient/Caregiver did not have any additional questions or concerns.   Patient's therapy is appropriate to: Continue    Goals Addressed             This Visit's Progress    Reduce risk of transplant rejection       Patient is on track. Patient will maintain adherence. Per patient recent provider appointment showed appropriate response.          Khaleesi Gruel M Jasia Hiltunen Specialty Pharmacist

## 2023-08-15 NOTE — Progress Notes (Signed)
 Specialty Pharmacy Initial Fill Coordination Note  Elizabeth Norton is a 48 y.o. female contacted today regarding initial fill of specialty medication(s) Tacrolimus  (Envarsus  XR)   Patient requested [name] [place] date: 08/15/23   Medication will be filled on 05.09.25.   Patient is aware of 0.00 copayment.

## 2023-08-19 ENCOUNTER — Ambulatory Visit: Payer: Self-pay | Admitting: Surgery

## 2023-08-20 ENCOUNTER — Other Ambulatory Visit: Payer: Self-pay | Admitting: Emergency Medicine

## 2023-08-20 ENCOUNTER — Other Ambulatory Visit: Payer: Self-pay

## 2023-08-22 ENCOUNTER — Other Ambulatory Visit (HOSPITAL_COMMUNITY): Payer: Self-pay

## 2023-08-22 MED ORDER — DSS 100 MG PO CAPS
100.0000 mg | ORAL_CAPSULE | Freq: Every day | ORAL | 0 refills | Status: AC
  Filled 2023-08-22: qty 14, 14d supply, fill #0

## 2023-08-25 ENCOUNTER — Other Ambulatory Visit (HOSPITAL_COMMUNITY): Payer: Self-pay

## 2023-09-04 NOTE — Progress Notes (Signed)
 COVID Vaccine received:  []  No [x]  Yes Date of any COVID positive Test in last 90 days:  PCP - Maryagnes Small, MD [phone] (Work)  [phone] (Fax)  Cardiologist -  Transplant Coordinator - Unknown Garbe, RN-  Hedy Living, MD  [facility] Soyla Duverney [phone]      Chest x-ray - 07-06-2021 2v  CEW EKG - (07-20-18)  will repeat  Stress Test -  ECHO -  Cardiac Cath -  CT Coronary Calcium score:   Bowel Prep - [x]  No  []   Yes ______  Pacemaker / ICD device [x]  No []  Yes   Spinal Cord Stimulator:[x]  No []  Yes       History of Sleep Apnea? []  No []  Yes   CPAP used?- []  No []  Yes    Does the patient monitor blood sugar?   [x]  N/A   []  No []  Yes  Patient has: [x]  NO Hx DM   []  Pre-DM   []  DM1  []   DM2 Last A1c was:  normal 5.6  on   12-04-22    Blood Thinner / Instructions:  none Aspirin  Instructions:  ASA 81 mg  ERAS Protocol Ordered: []  No  [x]  Yes PRE-SURGERY []  ENSURE  []  G2   [x]  No Drink Ordered Patient is to be NPO after: 0530  Dental hx: []  Dentures:  []  N/A      []  Bridge or Partial:                   []  Loose or Damaged teeth:   Comments:   Activity level: Able to walk up 2 flights of stairs without becoming significantly short of breath or having chest pain?  []  No   []    Yes   Anesthesia review: Tachycardia, s/p liver transplant for cirrhosis, fibromyalgia  Patient denies shortness of breath, fever, cough and chest pain at PAT appointment.  Patient verbalized understanding and agreement to the Pre-Surgical Instructions that were given to them at this PAT appointment. Patient was also educated of the need to review these PAT instructions again prior to her surgery.I reviewed the appropriate phone numbers to call if they have any and questions or concerns.

## 2023-09-04 NOTE — Patient Instructions (Signed)
 SURGICAL WAITING ROOM VISITATION Patients having surgery or a procedure may have no more than 2 support people in the waiting area - these visitors may rotate in the visitor waiting room.   If the patient needs to stay at the hospital during part of their recovery, the visitor guidelines for inpatient rooms apply.  PRE-OP VISITATION  Pre-op nurse will coordinate an appropriate time for 1 support person to accompany the patient in pre-op.  This support person may not rotate.  This visitor will be contacted when the time is appropriate for the visitor to come back in the pre-op area.  Please refer to the [facility] website for the visitor guidelines for Inpatients (after your surgery is over and you are in a regular room).  You are not required to quarantine at this time prior to your surgery. However, you must do this: Hand Hygiene often Do NOT share personal items Notify your provider if you are in close contact with someone who has COVID or you develop fever 100.4 or greater, new onset of sneezing, cough, sore throat, shortness of breath or body aches.  If you test positive for Covid or have been in contact with anyone that has tested positive in the last 10 days please notify you surgeon.    Your procedure is scheduled on:  Wednesday  September 17, 2023  Report to [facility] Main Entrance: [place] [address] entrance where the [employer] is available.   Report to admitting at:  06:15   AM  Call this number if you have any questions or problems the morning of surgery [phone]  FOLLOW ANY ADDITIONAL PRE OP INSTRUCTIONS YOU RECEIVED FROM YOUR SURGEON'S OFFICE!!!  Do not eat food after Midnight the night prior to your surgery/procedure.  After Midnight you may have the following liquids until   05:30 AM DAY OF SURGERY  Clear Liquid Diet Water Black Coffee (sugar ok, NO MILK/CREAM OR CREAMERS)  Tea (sugar ok, NO MILK/CREAM OR CREAMERS) regular and decaf                              Plain Jell-O  with no fruit (NO RED)                                           Fruit ices (not with fruit pulp, NO RED)                                     Popsicles (NO RED)                                                                  Juice: NO CITRUS JUICES: only apple, WHITE grape, WHITE cranberry Sports drinks like Gatorade or Powerade (NO RED)                  Oral Hygiene is also important to reduce your risk of infection.        Remember - BRUSH YOUR TEETH THE MORNING OF SURGERY WITH YOUR REGULAR TOOTHPASTE  Do NOT smoke after Midnight the night before surgery.  STOP TAKING all Vitamins, Herbs and supplements 1 week before your surgery.   Take ONLY these medicines the morning of surgery with A SIP OF WATER: tacrolimus                    You may not have any metal on your body including hair pins, jewelry, and body piercing  Do not wear make-up, lotions, powders, perfumes or deodorant  Do not wear nail polish including gel and S&S, artificial / acrylic nails, or any other type of covering on natural nails including finger and toenails. If you have artificial nails, gel coating, etc., that needs to be removed by a nail salon, Please have this removed prior to surgery. Not doing so may mean that your surgery could be cancelled or delayed if the Surgeon or anesthesia staff feels like they are unable to monitor you safely.   Do not shave 48 hours prior to surgery to avoid nicks in your skin which may contribute to postoperative infections.    Contacts, Hearing Aids, dentures or bridgework may not be worn into surgery. DENTURES WILL BE REMOVED PRIOR TO SURGERY PLEASE DO NOT APPLY "Poly grip" OR ADHESIVES!!!  You may bring a small overnight bag with you on the day of surgery, only pack items that are not valuable. [facility] IS NOT RESPONSIBLE   FOR VALUABLES THAT ARE LOST OR STOLEN.   Patients discharged on the day of surgery will not be allowed to drive home.  Someone NEEDS to  stay with you for the first 24 hours after anesthesia.  Do not bring your home medications to the hospital. The Pharmacy will dispense medications listed on your medication list to you during your admission in the Hospital.  Special Instructions: Bring a copy of your healthcare power of attorney and living will documents the day of surgery, if you wish to have them scanned into your [facility] Records- EPIC  Please read over the following fact sheets you were given: IF YOU HAVE QUESTIONS ABOUT YOUR PRE-OP INSTRUCTIONS, PLEASE CALL [phone]   [facility] - Preparing for Surgery Before surgery, you can play an important role.  Because skin is not sterile, your skin needs to be as free of germs as possible.  You can reduce the number of germs on your skin by washing with CHG (chlorahexidine gluconate) soap before surgery.  CHG is an antiseptic cleaner which kills germs and bonds with the skin to continue killing germs even after washing. Please DO NOT use if you have an allergy to CHG or antibacterial soaps.  If your skin becomes reddened/irritated stop using the CHG and inform your nurse when you arrive at Short Stay. Do not shave (including legs and underarms) for at least 48 hours prior to the first CHG shower.  You may shave your face/neck.  Please follow these instructions carefully:  1.  Shower with CHG Soap the night before surgery and the  morning of surgery.  2.  If you choose to wash your hair, wash your hair first as usual with your normal  shampoo.  3.  After you shampoo, rinse your hair and body thoroughly to remove the shampoo.                             4.  Use CHG as you would any other liquid soap.  You can apply chg directly  to the skin and wash.  Gently with a scrungie or clean washcloth.  5.  Apply the CHG Soap to your body ONLY FROM THE NECK DOWN.   Do not use on face/ open                           Wound or open sores. Avoid contact with eyes, ears mouth and  genitals (private parts).                       Wash face,  Genitals (private parts) with your normal soap.             6.  Wash thoroughly, paying special attention to the area where your  surgery  will be performed.  7.  Thoroughly rinse your body with warm water from the neck down.  8.  DO NOT shower/wash with your normal soap after using and rinsing off the CHG Soap.            9.  Pat yourself dry with a clean towel.            10.  Wear clean pajamas.            11.  Place clean sheets on your bed the night of your first shower and do not  sleep with pets.  ON THE DAY OF SURGERY : Do not apply any lotions/deodorants the morning of surgery.  Please wear clean clothes to the hospital/surgery center.     FAILURE TO FOLLOW THESE INSTRUCTIONS MAY RESULT IN THE CANCELLATION OF YOUR SURGERY  PATIENT SIGNATURE_________________________________  NURSE SIGNATURE__________________________________  ________________________________________________________________________

## 2023-09-05 ENCOUNTER — Other Ambulatory Visit: Payer: Self-pay

## 2023-09-05 ENCOUNTER — Encounter (HOSPITAL_COMMUNITY): Payer: Self-pay

## 2023-09-05 ENCOUNTER — Encounter (HOSPITAL_COMMUNITY)
Admission: RE | Admit: 2023-09-05 | Discharge: 2023-09-05 | Disposition: A | Discharge status: Home / Self Care | Source: Ambulatory Visit | Attending: Surgery | Admitting: Surgery

## 2023-09-05 VITALS — BP 122/92 | HR 90 | Temp 98.3°F | Resp 16 | Ht 66.0 in | Wt 248.0 lb

## 2023-09-05 DIAGNOSIS — I10 Essential (primary) hypertension: Secondary | ICD-10-CM | POA: Diagnosis not present

## 2023-09-05 DIAGNOSIS — Z01818 Encounter for other preprocedural examination: Secondary | ICD-10-CM | POA: Insufficient documentation

## 2023-09-05 DIAGNOSIS — Z944 Liver transplant status: Secondary | ICD-10-CM | POA: Diagnosis not present

## 2023-09-05 DIAGNOSIS — K432 Incisional hernia without obstruction or gangrene: Secondary | ICD-10-CM | POA: Insufficient documentation

## 2023-09-05 HISTORY — DX: Cardiac arrhythmia, unspecified: I49.9

## 2023-09-05 HISTORY — DX: Malignant (primary) neoplasm, unspecified: C80.1

## 2023-09-05 HISTORY — DX: Unspecified osteoarthritis, unspecified site: M19.90

## 2023-09-05 HISTORY — DX: Essential (primary) hypertension: I10

## 2023-09-05 LAB — COMPREHENSIVE METABOLIC PANEL WITH GFR
ALT: 52 U/L — ABNORMAL HIGH (ref 0–44)
AST: 66 U/L — ABNORMAL HIGH (ref 15–41)
Albumin: 3.5 g/dL (ref 3.5–5.0)
Alkaline Phosphatase: 114 U/L (ref 38–126)
Anion gap: 6 (ref 5–15)
BUN: 11 mg/dL (ref 6–20)
CO2: 24 mmol/L (ref 22–32)
Calcium: 8.2 mg/dL — ABNORMAL LOW (ref 8.9–10.3)
Chloride: 104 mmol/L (ref 98–111)
Creatinine, Ser: 0.62 mg/dL (ref 0.44–1.00)
GFR, Estimated: >60 mL/min (ref >60)
Glucose, Bld: 121 mg/dL — ABNORMAL HIGH (ref 70–99)
Potassium: 3.7 mmol/L (ref 3.5–5.1)
Sodium: 134 mmol/L — ABNORMAL LOW (ref 135–145)
Total Bilirubin: 0.7 mg/dL (ref 0.0–1.2)
Total Protein: 7 g/dL (ref 6.5–8.1)

## 2023-09-05 LAB — CBC
HCT: 36.6 % (ref 36.0–46.0)
Hemoglobin: 13 g/dL (ref 12.0–15.0)
MCH: 37.8 pg — ABNORMAL HIGH (ref 26.0–34.0)
MCHC: 35.5 g/dL (ref 30.0–36.0)
MCV: 106.4 fL — ABNORMAL HIGH (ref 80.0–100.0)
Platelets: 164 10*3/uL (ref 150–400)
RBC: 3.44 MIL/uL — ABNORMAL LOW (ref 3.87–5.11)
RDW: 15.9 % — ABNORMAL HIGH (ref 11.5–15.5)
WBC: 5.6 10*3/uL (ref 4.0–10.5)
nRBC: 0 % (ref 0.0–0.2)

## 2023-09-08 ENCOUNTER — Other Ambulatory Visit (HOSPITAL_COMMUNITY): Payer: Self-pay

## 2023-09-08 NOTE — Progress Notes (Signed)
 Anesthesia Chart Review   Case: [id number] Date/Time: 09/17/23 0815   Procedure: REPAIR, HERNIA, VENTRAL, ROBOT-ASSISTED   Anesthesia type: General   Diagnosis:      Incisional hernia, without obstruction or gangrene [K43.2]     Ventral hernia without obstruction or gangrene [K43.9]     Incisional hernia following transplant [K43.2, Z94.9]   Pre-op diagnosis: INCISIONAL HERNIA   Location: [facility] ROOM 02 / [facility]   Surgeons: Junie Olds, MD       DISCUSSION:47 y.o. never smoker with h/o HTN, liver chirrhosis s/p liver transplant 07/11/2020, incisional hernia scheduled for above procedure 09/17/2023 with Dr. Teddie Favre.   Pt last seen by transplant clinic 08/04/2023. She remains on tacrolimus . Stable at this office visit. Labs stable.   VS: BP (!) 122/92 Comment: right arm sitting  Pulse 90   Temp 36.8 C (Oral)   Resp 16   Ht 5\' 6"  (1.676 m)   Wt 112.5 kg   LMP 12/12/2016 (Exact Date)   SpO2 99%   BMI 40.03 kg/m   PROVIDERS: Elvira Hammersmith, MD is PCP    LABS: Labs reviewed: Acceptable for surgery. (all labs ordered are listed, but only abnormal results are displayed)  Labs Reviewed  COMPREHENSIVE METABOLIC PANEL WITH GFR - Abnormal; Notable for the following components:      Result Value   Sodium 134 (*)    Glucose, Bld 121 (*)    Calcium 8.2 (*)    AST 66 (*)    ALT 52 (*)    All other components within normal limits  CBC - Abnormal; Notable for the following components:   RBC 3.44 (*)    MCV 106.4 (*)    MCH 37.8 (*)    RDW 15.9 (*)    All other components within normal limits     IMAGES:   EKG:   CV:  Past Medical History:  Diagnosis Date   Arthritis    Cancer (HCC)    Cirrhosis of liver (HCC)    Depression    Dysrhythmia    tachycardia   High cholesterol    Hypertension     Past Surgical History:  Procedure Laterality Date   CHOLECYSTECTOMY     gallbladder removed when liver transplant was done   INCISIONAL HERNIA REPAIR  N/A 03/04/2023   Procedure: INCARCERATED INCISIONAL HERNIA REPAIR;  Surgeon: Oralee Billow, MD;  Location: [facility];  Service: General;  Laterality: N/A;   LIVER TRANSPLANT  07/11/2020   done at [facility] in [place]   right wrist fracture Right 2006   ORIF  then screws removed after bone healed   WISDOM TOOTH EXTRACTION      MEDICATIONS:  aspirin  EC (ASPIRIN  81) 81 MG tablet   Docusate Sodium  (DSS) 100 MG CAPS   tacrolimus  ER (ENVARSUS  XR) 1 MG TB24   tacrolimus  ER (ENVARSUS  XR) 4 MG TB24   Vitamin D , Ergocalciferol , (DRISDOL ) 1.25 MG (50000 UNIT) CAPS capsule   No current facility-administered medications for this encounter.    Chick Cotton Ward, PA-C [facility] Pre-Surgical Testing [phone]

## 2023-09-16 NOTE — Anesthesia Preprocedure Evaluation (Signed)
 Anesthesia Evaluation  Patient identified by MRN, date of birth, ID band Patient awake    Reviewed: Allergy & Precautions, NPO status , Patient's Chart, lab work & pertinent test results  History of Anesthesia Complications Negative for: history of anesthetic complications  Airway Mallampati: I  TM Distance: >3 FB Neck ROM: Full    Dental no notable dental hx.    Pulmonary neg pulmonary ROS   Pulmonary exam normal        Cardiovascular hypertension, Normal cardiovascular exam     Neuro/Psych    Depression       GI/Hepatic negative GI ROS,,,Cirrhosis s/p liver transplant 07/11/2020   Endo/Other    Class 3 obesity  Renal/GU negative Renal ROS     Musculoskeletal  (+) Arthritis ,  Fibromyalgia -  Abdominal   Peds  Hematology negative hematology ROS (+)   Anesthesia Other Findings INCISIONAL HERNIA  Reproductive/Obstetrics negative OB ROS                              Anesthesia Physical Anesthesia Plan  ASA: 3  Anesthesia Plan: General   Post-op Pain Management: Dilaudid  IV and Ketamine IV*   Induction: Intravenous  PONV Risk Score and Plan: Treatment may vary due to age or medical condition, Ondansetron , Dexamethasone  and Midazolam   Airway Management Planned: Oral ETT  Additional Equipment: Arterial line  Intra-op Plan:   Post-operative Plan: Possible Post-op intubation/ventilation  Informed Consent: I have reviewed the patients History and Physical, chart, labs and discussed the procedure including the risks, benefits and alternatives for the proposed anesthesia with the patient or authorized representative who has indicated his/her understanding and acceptance.     Dental advisory given  Plan Discussed with: CRNA  Anesthesia Plan Comments:         Anesthesia Quick Evaluation

## 2023-09-17 ENCOUNTER — Encounter (HOSPITAL_COMMUNITY)
Admission: RE | Disposition: A | Discharge status: Home / Self Care | Payer: Self-pay | Source: Ambulatory Visit | Attending: Surgery

## 2023-09-17 ENCOUNTER — Observation Stay (HOSPITAL_COMMUNITY)
Admission: RE | Admit: 2023-09-17 | Discharge: 2023-09-19 | Disposition: A | Discharge status: Home / Self Care | Source: Ambulatory Visit | Attending: Surgery | Admitting: Surgery

## 2023-09-17 ENCOUNTER — Other Ambulatory Visit: Payer: Self-pay

## 2023-09-17 ENCOUNTER — Ambulatory Visit (HOSPITAL_COMMUNITY): Payer: Self-pay | Admitting: Certified Registered"

## 2023-09-17 ENCOUNTER — Encounter (HOSPITAL_COMMUNITY): Payer: Self-pay | Admitting: Surgery

## 2023-09-17 ENCOUNTER — Ambulatory Visit (HOSPITAL_COMMUNITY): Payer: Self-pay | Admitting: Physician Assistant

## 2023-09-17 DIAGNOSIS — Z944 Liver transplant status: Secondary | ICD-10-CM | POA: Insufficient documentation

## 2023-09-17 DIAGNOSIS — Z6841 Body Mass Index (BMI) 40.0 and over, adult: Secondary | ICD-10-CM

## 2023-09-17 DIAGNOSIS — K432 Incisional hernia without obstruction or gangrene: Secondary | ICD-10-CM

## 2023-09-17 DIAGNOSIS — E66813 Obesity, class 3: Secondary | ICD-10-CM

## 2023-09-17 DIAGNOSIS — I10 Essential (primary) hypertension: Secondary | ICD-10-CM | POA: Diagnosis not present

## 2023-09-17 DIAGNOSIS — Z91040 Latex allergy status: Secondary | ICD-10-CM | POA: Insufficient documentation

## 2023-09-17 DIAGNOSIS — Z7982 Long term (current) use of aspirin: Secondary | ICD-10-CM | POA: Diagnosis not present

## 2023-09-17 DIAGNOSIS — K439 Ventral hernia without obstruction or gangrene: Secondary | ICD-10-CM | POA: Diagnosis not present

## 2023-09-17 DIAGNOSIS — Z79899 Other long term (current) drug therapy: Secondary | ICD-10-CM | POA: Insufficient documentation

## 2023-09-17 HISTORY — PX: XI ROBOTIC ASSISTED VENTRAL HERNIA: SHX6789

## 2023-09-17 LAB — COMPREHENSIVE METABOLIC PANEL WITH GFR
ALT: 34 U/L (ref 0–44)
AST: 48 U/L — ABNORMAL HIGH (ref 15–41)
Albumin: 3.1 g/dL — ABNORMAL LOW (ref 3.5–5.0)
Alkaline Phosphatase: 94 U/L (ref 38–126)
Anion gap: 8 (ref 5–15)
BUN: 13 mg/dL (ref 6–20)
CO2: 23 mmol/L (ref 22–32)
Calcium: 8.6 mg/dL — ABNORMAL LOW (ref 8.9–10.3)
Chloride: 106 mmol/L (ref 98–111)
Creatinine, Ser: 0.69 mg/dL (ref 0.44–1.00)
GFR, Estimated: >60 mL/min (ref >60)
Glucose, Bld: 120 mg/dL — ABNORMAL HIGH (ref 70–99)
Potassium: 4.4 mmol/L (ref 3.5–5.1)
Sodium: 137 mmol/L (ref 135–145)
Total Bilirubin: 1 mg/dL (ref 0.0–1.2)
Total Protein: 6 g/dL — ABNORMAL LOW (ref 6.5–8.1)

## 2023-09-17 LAB — CBC
HCT: 32.4 % — ABNORMAL LOW (ref 36.0–46.0)
HCT: 34 % — ABNORMAL LOW (ref 36.0–46.0)
Hemoglobin: 11.1 g/dL — ABNORMAL LOW (ref 12.0–15.0)
Hemoglobin: 11.5 g/dL — ABNORMAL LOW (ref 12.0–15.0)
MCH: 37.1 pg — ABNORMAL HIGH (ref 26.0–34.0)
MCH: 37.7 pg — ABNORMAL HIGH (ref 26.0–34.0)
MCHC: 34.3 g/dL (ref 30.0–36.0)
MCHC: 33.8 g/dL (ref 30.0–36.0)
MCV: 108.4 fL — ABNORMAL HIGH (ref 80.0–100.0)
MCV: 111.5 fL — ABNORMAL HIGH (ref 80.0–100.0)
Platelets: 129 10*3/uL — ABNORMAL LOW (ref 150–400)
Platelets: 140 10*3/uL — ABNORMAL LOW (ref 150–400)
RBC: 2.99 MIL/uL — ABNORMAL LOW (ref 3.87–5.11)
RBC: 3.05 MIL/uL — ABNORMAL LOW (ref 3.87–5.11)
RDW: 16.1 % — ABNORMAL HIGH (ref 11.5–15.5)
RDW: 16.5 % — ABNORMAL HIGH (ref 11.5–15.5)
WBC: 4.4 10*3/uL (ref 4.0–10.5)
WBC: 7.6 10*3/uL (ref 4.0–10.5)
nRBC: 0 % (ref 0.0–0.2)
nRBC: 0 % (ref 0.0–0.2)

## 2023-09-17 LAB — TYPE AND SCREEN
ABO/RH(D): B POS
Antibody Screen: NEGATIVE

## 2023-09-17 LAB — ABO/RH: ABO/RH(D): B POS

## 2023-09-17 LAB — CREATININE, SERUM
Creatinine, Ser: 0.92 mg/dL (ref 0.44–1.00)
GFR, Estimated: >60 mL/min (ref >60)

## 2023-09-17 LAB — PROTIME-INR
INR: 1.2 (ref 0.8–1.2)
Prothrombin Time: 15.3 s — ABNORMAL HIGH (ref 11.4–15.2)

## 2023-09-17 SURGERY — REPAIR, HERNIA, VENTRAL, ROBOT-ASSISTED
Anesthesia: General

## 2023-09-17 MED ORDER — ORAL CARE MOUTH RINSE: 15.0000 mL | Freq: Once | OROMUCOSAL | Status: AC

## 2023-09-17 MED ORDER — ONDANSETRON HCL 4 MG/2ML IJ SOLN
INTRAMUSCULAR | Status: DC | PRN
  Administered 2023-09-17: 4 mg via INTRAVENOUS

## 2023-09-17 MED ORDER — BUPIVACAINE LIPOSOME 1.3 % IJ SUSP: 20.0000 mL | Freq: Once | INTRAMUSCULAR | Status: DC

## 2023-09-17 MED ORDER — ROCURONIUM BROMIDE 100 MG/10ML IV SOLN
INTRAVENOUS | Status: DC | PRN
  Administered 2023-09-17: 40 mg via INTRAVENOUS
  Administered 2023-09-17: 20 mg via INTRAVENOUS
  Administered 2023-09-17 (×2): 10 mg via INTRAVENOUS
  Administered 2023-09-17: 100 mg via INTRAVENOUS

## 2023-09-17 MED ORDER — SUGAMMADEX SODIUM 200 MG/2ML IV SOLN
INTRAVENOUS | Status: DC | PRN
  Administered 2023-09-17: 200 mg via INTRAVENOUS
  Administered 2023-09-17: 100 mg via INTRAVENOUS

## 2023-09-17 MED ORDER — 0.9 % SODIUM CHLORIDE (POUR BTL) OPTIME
TOPICAL | Status: DC | PRN
Start: 2023-09-17 — End: 2023-09-17
  Administered 2023-09-17: 1000 mL

## 2023-09-17 MED ORDER — HYDROMORPHONE HCL 1 MG/ML IJ SOLN
INTRAMUSCULAR | Status: AC
  Filled 2023-09-17: qty 1

## 2023-09-17 MED ORDER — LACTATED RINGERS IV SOLN: INTRAVENOUS | Status: DC | PRN

## 2023-09-17 MED ORDER — HYDROMORPHONE HCL 2 MG/ML IJ SOLN
INTRAMUSCULAR | Status: AC
  Filled 2023-09-17: qty 1

## 2023-09-17 MED ORDER — STERILE WATER FOR IRRIGATION IR SOLN
Status: DC | PRN
  Administered 2023-09-17: 1000 mL

## 2023-09-17 MED ORDER — LACTATED RINGERS IV SOLN: INTRAVENOUS | Status: DC

## 2023-09-17 MED ORDER — HYDROMORPHONE HCL 1 MG/ML IJ SOLN
0.2500 mg | INTRAMUSCULAR | Status: DC | PRN
  Administered 2023-09-17 (×4): 0.5 mg via INTRAVENOUS

## 2023-09-17 MED ORDER — HYDROMORPHONE HCL 1 MG/ML IJ SOLN
1.0000 mg | INTRAMUSCULAR | Status: DC | PRN
  Administered 2023-09-17 (×2): 1 mg via INTRAVENOUS
  Filled 2023-09-17 (×2): qty 1

## 2023-09-17 MED ORDER — ENOXAPARIN SODIUM 40 MG/0.4ML IJ SOSY
40.0000 mg | PREFILLED_SYRINGE | INTRAMUSCULAR | Status: DC
  Administered 2023-09-18 – 2023-09-19 (×2): 40 mg via SUBCUTANEOUS
  Filled 2023-09-17 (×2): qty 0.4

## 2023-09-17 MED ORDER — ACETAMINOPHEN 500 MG PO TABS
1000.0000 mg | ORAL_TABLET | ORAL | Status: AC
  Administered 2023-09-17: 1000 mg via ORAL
  Filled 2023-09-17: qty 2

## 2023-09-17 MED ORDER — OXYCODONE HCL 5 MG PO TABS
5.0000 mg | ORAL_TABLET | ORAL | Status: DC | PRN
  Filled 2023-09-17: qty 1

## 2023-09-17 MED ORDER — BUPIVACAINE LIPOSOME 1.3 % IJ SUSP
INTRAMUSCULAR | Status: DC | PRN
Start: 2023-09-17 — End: 2023-09-17
  Administered 2023-09-17: 20 mL

## 2023-09-17 MED ORDER — KETAMINE HCL 50 MG/5ML IJ SOSY
PREFILLED_SYRINGE | INTRAMUSCULAR | Status: AC
Start: 2023-09-17 — End: 2023-09-17
  Filled 2023-09-17: qty 5

## 2023-09-17 MED ORDER — ASPIRIN 81 MG PO TBEC
81.0000 mg | DELAYED_RELEASE_TABLET | Freq: Every day | ORAL | Status: DC
  Administered 2023-09-17 – 2023-09-19 (×3): 81 mg via ORAL
  Filled 2023-09-17 (×3): qty 1

## 2023-09-17 MED ORDER — METHOCARBAMOL 1000 MG/10ML IJ SOLN
500.0000 mg | Freq: Four times a day (QID) | INTRAMUSCULAR | Status: DC | PRN
  Administered 2023-09-17 – 2023-09-19 (×4): 500 mg via INTRAVENOUS
  Filled 2023-09-17 (×4): qty 10

## 2023-09-17 MED ORDER — PROCHLORPERAZINE EDISYLATE 10 MG/2ML IJ SOLN: 10.0000 mg | INTRAMUSCULAR | Status: DC | PRN

## 2023-09-17 MED ORDER — PHENYLEPHRINE HCL-NACL 20-0.9 MG/250ML-% IV SOLN
INTRAVENOUS | Status: DC | PRN
  Administered 2023-09-17: 20 ug/min via INTRAVENOUS

## 2023-09-17 MED ORDER — FENTANYL CITRATE (PF) 250 MCG/5ML IJ SOLN
INTRAMUSCULAR | Status: AC
  Filled 2023-09-17: qty 5

## 2023-09-17 MED ORDER — ENOXAPARIN SODIUM 40 MG/0.4ML IJ SOSY
40.0000 mg | PREFILLED_SYRINGE | Freq: Once | INTRAMUSCULAR | Status: AC
  Administered 2023-09-17: 40 mg via SUBCUTANEOUS
  Filled 2023-09-17: qty 0.4

## 2023-09-17 MED ORDER — MIDAZOLAM HCL 5 MG/5ML IJ SOLN
INTRAMUSCULAR | Status: DC | PRN
  Administered 2023-09-17: 2 mg via INTRAVENOUS

## 2023-09-17 MED ORDER — TACROLIMUS ER 4 MG PO TB24
5.0000 mg | ORAL_TABLET | Freq: Every day | ORAL | Status: DC
  Administered 2023-09-18 – 2023-09-19 (×2): 5 mg via ORAL
  Filled 2023-09-17 (×2): qty 1

## 2023-09-17 MED ORDER — TACROLIMUS ER 1 MG PO TB24: 1.0000 mg | ORAL_TABLET | Freq: Every day | ORAL | Status: DC

## 2023-09-17 MED ORDER — LIDOCAINE HCL (CARDIAC) PF 100 MG/5ML IV SOSY
PREFILLED_SYRINGE | INTRAVENOUS | Status: DC | PRN
  Administered 2023-09-17: 100 mg via INTRAVENOUS

## 2023-09-17 MED ORDER — PROPOFOL 10 MG/ML IV BOLUS
INTRAVENOUS | Status: DC | PRN
  Administered 2023-09-17: 200 mg via INTRAVENOUS

## 2023-09-17 MED ORDER — BUPIVACAINE-EPINEPHRINE 0.25% -1:200000 IJ SOLN
INTRAMUSCULAR | Status: DC | PRN
  Administered 2023-09-17: 30 mL

## 2023-09-17 MED ORDER — DROPERIDOL 2.5 MG/ML IJ SOLN: 0.6250 mg | Freq: Once | INTRAMUSCULAR | Status: DC | PRN

## 2023-09-17 MED ORDER — CLINDAMYCIN PHOSPHATE 900 MG/50ML IV SOLN
900.0000 mg | INTRAVENOUS | Status: AC
  Administered 2023-09-17: 900 mg via INTRAVENOUS
  Filled 2023-09-17: qty 50

## 2023-09-17 MED ORDER — BUPIVACAINE LIPOSOME 1.3 % IJ SUSP
INTRAMUSCULAR | Status: AC
  Filled 2023-09-17: qty 20

## 2023-09-17 MED ORDER — PROPOFOL 10 MG/ML IV BOLUS
INTRAVENOUS | Status: AC
  Filled 2023-09-17: qty 20

## 2023-09-17 MED ORDER — ONDANSETRON HCL 4 MG/2ML IJ SOLN: 4.0000 mg | Freq: Four times a day (QID) | INTRAMUSCULAR | Status: DC | PRN

## 2023-09-17 MED ORDER — CHLORHEXIDINE GLUCONATE 0.12 % MT SOLN
15.0000 mL | Freq: Once | OROMUCOSAL | Status: AC
Start: 2023-09-17 — End: 2023-09-17
  Administered 2023-09-17: 15 mL via OROMUCOSAL

## 2023-09-17 MED ORDER — KETAMINE HCL 50 MG/5ML IJ SOSY
PREFILLED_SYRINGE | INTRAMUSCULAR | Status: DC | PRN
  Administered 2023-09-17: 10 mg via INTRAVENOUS
  Administered 2023-09-17: 30 mg via INTRAVENOUS

## 2023-09-17 MED ORDER — OXYCODONE HCL 5 MG PO TABS
10.0000 mg | ORAL_TABLET | ORAL | Status: DC | PRN
  Administered 2023-09-17 – 2023-09-19 (×7): 10 mg via ORAL
  Filled 2023-09-17 (×7): qty 2

## 2023-09-17 MED ORDER — GABAPENTIN 300 MG PO CAPS
300.0000 mg | ORAL_CAPSULE | ORAL | Status: AC
  Administered 2023-09-17: 300 mg via ORAL
  Filled 2023-09-17: qty 1

## 2023-09-17 MED ORDER — CHLORHEXIDINE GLUCONATE CLOTH 2 % EX PADS: 6.0000 | MEDICATED_PAD | Freq: Once | CUTANEOUS | Status: DC

## 2023-09-17 MED ORDER — MIDAZOLAM HCL 2 MG/2ML IJ SOLN
INTRAMUSCULAR | Status: AC
  Filled 2023-09-17: qty 2

## 2023-09-17 MED ORDER — TACROLIMUS ER 4 MG PO TB24: 4.0000 mg | ORAL_TABLET | Freq: Every day | ORAL | Status: DC

## 2023-09-17 MED ORDER — SIMETHICONE 80 MG PO CHEW: 80.0000 mg | CHEWABLE_TABLET | Freq: Four times a day (QID) | ORAL | Status: DC | PRN

## 2023-09-17 MED ORDER — HYDROMORPHONE HCL 1 MG/ML IJ SOLN
INTRAMUSCULAR | Status: DC | PRN
  Administered 2023-09-17: .4 mg via INTRAVENOUS
  Administered 2023-09-17: .2 mg via INTRAVENOUS
  Administered 2023-09-17 (×2): .4 mg via INTRAVENOUS
  Administered 2023-09-17: .6 mg via INTRAVENOUS

## 2023-09-17 MED ORDER — FENTANYL CITRATE (PF) 100 MCG/2ML IJ SOLN
INTRAMUSCULAR | Status: DC | PRN
  Administered 2023-09-17 (×3): 50 ug via INTRAVENOUS
  Administered 2023-09-17: 100 ug via INTRAVENOUS

## 2023-09-17 MED ORDER — DOCUSATE SODIUM 100 MG PO CAPS
100.0000 mg | ORAL_CAPSULE | Freq: Two times a day (BID) | ORAL | Status: DC
  Administered 2023-09-17 – 2023-09-19 (×4): 100 mg via ORAL
  Filled 2023-09-17 (×4): qty 1

## 2023-09-17 MED ORDER — BUPIVACAINE-EPINEPHRINE (PF) 0.25% -1:200000 IJ SOLN
INTRAMUSCULAR | Status: AC
Start: 2023-09-17 — End: 2023-09-17
  Filled 2023-09-17: qty 30

## 2023-09-17 MED ORDER — DROPERIDOL 2.5 MG/ML IJ SOLN
INTRAMUSCULAR | Status: AC
  Filled 2023-09-17: qty 2

## 2023-09-17 SURGICAL SUPPLY — 67 items
APPLICATOR COTTON TIP 6 STRL (MISCELLANEOUS) IMPLANT
APPLICATOR COTTON TIP 6IN STRL (MISCELLANEOUS) ×1 IMPLANT
BAG COUNTER SPONGE SURGICOUNT (BAG) ×1 IMPLANT
BLADE SURG SZ11 CARB STEEL (BLADE) ×1 IMPLANT
CHLORAPREP W/TINT 26 (MISCELLANEOUS) ×1 IMPLANT
COVER MAYO STAND STRL (DRAPES) ×1 IMPLANT
COVER SURGICAL LIGHT HANDLE (MISCELLANEOUS) ×1 IMPLANT
COVER TIP SHEARS 8 DVNC (MISCELLANEOUS) ×1 IMPLANT
DERMABOND ADVANCED .7 DNX12 (GAUZE/BANDAGES/DRESSINGS) IMPLANT
DEVICE TROCAR PUNCTURE CLOSURE (ENDOMECHANICALS) IMPLANT
DRAPE ARM DVNC X/XI (DISPOSABLE) ×3 IMPLANT
DRAPE COLUMN DVNC XI (DISPOSABLE) ×1 IMPLANT
DRIVER NDL LRG 8 DVNC XI (INSTRUMENTS) ×2 IMPLANT
DRIVER NDL MEGA SUTCUT DVNCXI (INSTRUMENTS) ×2 IMPLANT
DRIVER NDLE LRG 8 DVNC XI (INSTRUMENTS) IMPLANT
DRIVER NDLE MEGA SUTCUT DVNCXI (INSTRUMENTS) ×1 IMPLANT
ELECT PENCIL ROCKER SW 15FT (MISCELLANEOUS) ×1 IMPLANT
ELECT REM PT RETURN 15FT ADLT (MISCELLANEOUS) ×1 IMPLANT
ELECTRODE L-HOOK LAP 45CM DISP (ELECTROSURGICAL) ×1 IMPLANT
FORCEPS BPLR FENES DVNC XI (FORCEP) IMPLANT
FORCEPS PROGRASP DVNC XI (FORCEP) ×1 IMPLANT
GAUZE 4X4 16PLY ~~LOC~~+RFID DBL (SPONGE) IMPLANT
GLOVE BIO SURGEON STRL SZ7.5 (GLOVE) ×2 IMPLANT
GLOVE BIOGEL PI IND STRL 6 (GLOVE) IMPLANT
GLOVE BIOGEL PI IND STRL 8 (GLOVE) IMPLANT
GLOVE INDICATOR 8.0 STRL GRN (GLOVE) ×2 IMPLANT
GLOVE SURG SS PI 7.5 STRL IVOR (GLOVE) IMPLANT
GOWN STRL REUS W/ TWL LRG LVL3 (GOWN DISPOSABLE) IMPLANT
GOWN STRL REUS W/ TWL XL LVL3 (GOWN DISPOSABLE) ×2 IMPLANT
GRASPER SUT TROCAR 14GX15 (MISCELLANEOUS) IMPLANT
GRASPER TIP-UP FEN DVNC XI (INSTRUMENTS) ×1 IMPLANT
IRRIGATION SUCT STRKRFLW 2 WTP (MISCELLANEOUS) IMPLANT
KIT BASIN OR (CUSTOM PROCEDURE TRAY) ×1 IMPLANT
KIT TURNOVER KIT A (KITS) IMPLANT
MANIFOLD NEPTUNE II (INSTRUMENTS) ×1 IMPLANT
MARKER SKIN DUAL TIP RULER LAB (MISCELLANEOUS) ×1 IMPLANT
MESH SOFT 12X12IN BARD (Mesh General) IMPLANT
MESH VICRYL KNITTED 12X12 (Mesh General) IMPLANT
NDL SPNL 18GX3.5 QUINCKE PK (NEEDLE) ×1 IMPLANT
NEEDLE SPNL 18GX3.5 QUINCKE PK (NEEDLE) ×1 IMPLANT
NS IRRIG 1000ML POUR BTL (IV SOLUTION) IMPLANT
OBTURATOR OPTICALSTD 8 DVNC (TROCAR) ×1 IMPLANT
PACK CARDIOVASCULAR III (CUSTOM PROCEDURE TRAY) ×1 IMPLANT
SCISSORS MNPLR CVD DVNC XI (INSTRUMENTS) ×1 IMPLANT
SEAL UNIV 5-12 XI (MISCELLANEOUS) ×3 IMPLANT
SET TUBE SMOKE EVAC HIGH FLOW (TUBING) ×1 IMPLANT
SOLUTION ANTFG W/FOAM PAD STRL (MISCELLANEOUS) ×1 IMPLANT
SOLUTION ELECTROSURG ANTI STCK (MISCELLANEOUS) ×1 IMPLANT
SPIKE FLUID TRANSFER (MISCELLANEOUS) ×1 IMPLANT
SPONGE T-LAP 18X18 ~~LOC~~+RFID (SPONGE) ×1 IMPLANT
SUT ETHIBOND 0 36 GRN (SUTURE) IMPLANT
SUT MNCRL AB 4-0 PS2 18 (SUTURE) ×1 IMPLANT
SUT STRAFIX PDS 18 CTX (SUTURE) IMPLANT
SUT STRATA PDS 0 15 CT-1.5 (SUTURE) IMPLANT
SUT STRATA PDS 2-0 23 CT-1 (SUTURE) IMPLANT
SUT VIC AB 2-0 SH 27X BRD (SUTURE) IMPLANT
SUT VICRYL 0 27 CT2 27 ABS (SUTURE) IMPLANT
SUT VLOC 180 0 9IN GS21 (SUTURE) IMPLANT
SUTURE STRAFIX SYMMETRC 1-0 12 (SUTURE) IMPLANT
SUTURE STRAFIX SYMMETRC 1-0 24 (SUTURE) IMPLANT
SUTURE STRTFX SPRL PDS+ 2-0 23 (SUTURE) IMPLANT
SYR 20ML LL LF (SYRINGE) ×1 IMPLANT
TAPE STRIPS DRAPE STRL (GAUZE/BANDAGES/DRESSINGS) ×1 IMPLANT
TOWEL OR 17X26 10 PK STRL BLUE (TOWEL DISPOSABLE) ×1 IMPLANT
TROCAR ADV FIXATION 12X100MM (TROCAR) ×1 IMPLANT
TROCAR Z-THREAD FIOS 5X100MM (TROCAR) ×1 IMPLANT
WATER STERILE IRR 1000ML POUR (IV SOLUTION) IMPLANT

## 2023-09-17 NOTE — Anesthesia Procedure Notes (Signed)
 Procedure Name: Intubation Date/Time: 09/17/2023 9:01 AM  Performed by: Hunter Maha, CRNAPre-anesthesia Checklist: Patient identified, Emergency Drugs available, Suction available and Patient being monitored Patient Re-evaluated:Patient Re-evaluated prior to induction Oxygen Delivery Method: Circle system utilized Preoxygenation: Pre-oxygenation with 100% oxygen Induction Type: IV induction Ventilation: Mask ventilation without difficulty Laryngoscope Size: Mac and 3 Grade View: Grade I Tube type: Oral Tube size: 7.5 mm Number of attempts: 1 Airway Equipment and Method: Stylet and Oral airway Placement Confirmation: ETT inserted through vocal cords under direct vision, positive ETCO2 and breath sounds checked- equal and bilateral Secured at: 23 cm Tube secured with: Tape Dental Injury: Teeth and Oropharynx as per pre-operative assessment

## 2023-09-17 NOTE — Plan of Care (Signed)
   Problem: Activity: Goal: Risk for activity intolerance will decrease Outcome: Progressing   Problem: Nutrition: Goal: Adequate nutrition will be maintained Outcome: Progressing   Problem: Coping: Goal: Level of anxiety will decrease Outcome: Progressing   Problem: Pain Managment: Goal: General experience of comfort will improve and/or be controlled Outcome: Progressing

## 2023-09-17 NOTE — Transfer of Care (Signed)
 Immediate Anesthesia Transfer of Care Note  Patient: Fermina Herrington  Procedure(s) Performed: REPAIR, HERNIA, VENTRAL, ROBOT-ASSISTED  Patient Location: PACU  Anesthesia Type:General  Level of Consciousness: awake and alert   Airway & Oxygen Therapy: Patient Spontanous Breathing and Patient connected to nasal cannula oxygen  Post-op Assessment: Report given to RN and Post -op Vital signs reviewed and stable  Post vital signs: Reviewed and stable  Last Vitals:  Vitals Value Taken Time  BP 122/88 09/17/23 1526  Temp    Pulse 79 09/17/23 1528  Resp 13 09/17/23 1528  SpO2 100 % 09/17/23 1528  Vitals shown include unfiled device data.  Last Pain:  Vitals:   09/17/23 0700  TempSrc: Oral  PainSc:          Complications: No notable events documented.

## 2023-09-17 NOTE — Anesthesia Procedure Notes (Signed)
 Arterial Line Insertion Start/End6/02/2024 8:42 AM, 09/17/2023 8:45 AM Performed by: Vernadine Golas, MD, anesthesiologist  Patient location: Pre-op. Preanesthetic checklist: patient identified, IV checked, site marked, risks and benefits discussed, surgical consent, monitors and equipment checked, pre-op evaluation, timeout performed and anesthesia consent Patient sedated Right, radial was placed Catheter size: 20 G Hand hygiene performed  and maximum sterile barriers used   Attempts: 1 Procedure performed without using ultrasound guided technique. Following insertion, dressing applied and Biopatch. Post procedure assessment: normal and unchanged  Patient tolerated the procedure well with no immediate complications.

## 2023-09-17 NOTE — Op Note (Signed)
 Patient: Elizabeth Norton ([birth year], [id number])  Date of Surgery: 09/17/2023  Preoperative Diagnosis: Midline Incisional Hernia (6.0 wide by 21.2 cm tall subxiphoid, epigastric, periumbilical) Right flank recurrent incisional hernia (5.2 wide x 6.4 tall right flank hernia)  Measurements based on preoperative CT from 07/04/23  Postoperative Diagnosis:  Midline Incisional Hernia (6.0 wide by 21.2 cm tall subxiphoid, epigastric, periumbilical) Right flank recurrent incisional hernia (5.2 wide x 6.4 tall right flank hernia)  Measurements based on preoperative CT from 07/04/23  Surgical Procedure:  Robotic incisional hernia repair with mesh Bilateral posterior rectus myofascial release Bilateral transversus abdominis release    Operative Team Members:  Surgeons and Role:    * Serrina Minogue, Avon Boers, MD - Primary   Anesthesiologist: Vernadine Golas, MD CRNA: Marshall Skeeter, CRNA; Alwyn Juba, CRNA; Hunter Maha, CRNA   Anesthesia: General   Fluids:  Total I/O In: 3000 [I.V.:3000] Out: 430 [Urine:400; Blood:30]  Complications: None  Drains:  None  Specimen: None  Disposition:  PACU - hemodynamically stable.  Plan of Care: Admit for overnight observation  Indications for Procedure: Elizabeth Norton is a 47 y.o. female who presented with a recurrent right flank and initial midline incisional hernias from previous liver transplant (see representative images below).    I recommended robotic ventral hernia repair with mesh.  The procedure itself as well as the risks, benefits and alternatives were described.  The risks discussed included but were not limited to the risk of infection, bleeding, damage to nearby structures, recurrent hernia, chronic pain, and mesh complication requiring removal.  After a full discussion and all questions answered, the patient granted consent to proceed.  Findings:   6.0 cm wide by 21.2 cm tall midline hernia defect subxiphoid,  epigastric, periumbilical   5.2 wide x 6.4 tall right flank hernia  Two defects are more than 10 cm apart so listed as separate hernias  Hernia Location: Ventral hernia location: Subxiphoid (M1), Epigastric (M2), Umbilical (M3), and Lateral Flank (L2) Hernia Size:  6.0 cm wide by 21.2 cm tall midline incisional hernia and 5.2 cm wide by 6.4 cm tall right flank hernia  Mesh Size &Type:  37 cm tall x 37 cm wide Bard Soft Mesh Mesh Position: Sublay - Retromuscular Myofascial Releases: Bilateral posterior rectus myofascial release, Bilateral transversus abdominis myofascial release   Description of Procedure: The patient was positioned supine, moderately flexed at the umbilical level, padded and secured on the operating table.  A timeout procedure was performed.    What is described is a robotic, totally extraperitoneal retromuscular incisional hernia repair with bilateral posterior rectus myofascial release, bilateral transversus abdominis release and retromuscular mesh placement.  Due to the subxiphoid nature of the hernia, I docked from below and worked superiorly.  The posterior layer was closed with a vicryl mesh patch due to poor quality of the peritoneum near the area of the hernia.  Laparoscopic Portion: The retrorectus space was entered in the LEFT mid abdomen utilizing a 5 mm optical-viewing trocar.  Upon safe entry into this space, it was insufflated while performing a blunt dissection with the camera still in the optical trocar.   A rectus myofascial release was initiated laparoscopically on the LEFT side. Dissection was carried out laterally in the retromuscular plane to the edge of the rectus sheath progressively disconnecting the rectus muscle from the underlying posterior rectus sheath. Both the segmental innervation as well as the intercostal artery and vein brances to the rectus muscle were individually preserved.  During the left sided retrorectus dissection, a 8 mm trocar  was placed into the lateral most edge of the retrorectus space.  With these initial trocars in position, the medial most aspect of the retrorectus plane was identified, and the posterior sheath was visualized as it inserted on the linea alba. The posterior sheath was incised with cautery entering the preperitoneal plane. A crossover was performed dissecting under the linea alba in the preperitoneal plane until the right rectus sheath was identified.  After identification of the right rectus sheath, it was incised vertically to enter the retrorectus space on the right.   A rectus myofascial release was initiated laparoscopically on the RIGHT side.  Blunt dissection was carried out laterally in the retromuscular plane to the edge of the rectus sheath progressively disconnecting the rectus muscle from the underlying posterior rectus sheath. Both the segmental innervation as well as the intercostal artery and vein brances to the rectus muscle were individually preserved.   At this juncture, both retrorectus planes were initially connected to each other and there was space for further trocar placement. Two 8 mm robotic trocars were placed in the right rectus across the lower abdomen.  The initial 5 mm access trocar in the midclavicular line within the left retrorectus space was switched out for an 12 mm balloon assistant trocar.   Robotic Portion: The Intuitive daVinci Xi surgical robot was docked in the standard fashion and the procedure begun from the robotic console. A Prograsp instrument and monopolar shears were used for the dissection.  The rectus myofascial release was completed robotically on the RIGHT side.  Dissection was carried superiorly preserving the peritoneum and the preperitoneal fat in the midline as it was gently dissected off of the overlying linea alba.  On the right side, the posterior rectus sheath was progressively disconnected from its insertion on the linea alba. This allowed for  progression of the right side rectus myofascial release.  The rectus myofascial release accomplished medialization of the posterior rectus sheath towards the midline and disinsertion of the rectus muscle from its surrounding fascia, and thus its encasement in the rectus sheath, allowing for widening of the rectus muscle and transfer of the rectus flap towards the midline.  This will allow for future inset of the medial aspect of the flap for abdominal wall reconstruction.  Similarly, the rectus myofascial release was completed robotically on the LEFT side.  Dissection of the posterior rectus sheath was also progressively disconnected from its insertion on the linea alba.  This allowed for progression of the left side rectus myofascial release.  The rectus myofascial release accomplished medialization of the posterior rectus sheath towards the midline and disinsertion of the rectus muscle from its surrounding fascia, and thus its encasement in the rectus sheath, allowing for widening of the rectus muscle and transfer of the rectus flap towards the midline.  This will allow for future inset of the medial aspect of the flap for abdominal wall reconstruction.  During the dissection of the midline the hernia defect was identified and the hernia sac was not reducible, therefore the hernia peritoneum was incised circumferentially around the edge of the hernia defect which left a defect within the peritoneum in the midline.  This defect was later closed with a 15cm wide by 22 cm tall patch of vicryl mesh, sewn in circumfrentially using 2-0 Ethicon Stratafix Spiral PDS suture.  This patched the posterior closure providing tension free closure of the visceral sac.  Both the left and the  right rectus myofascial releases were performed from the arcuate line up to the costal margin.  During this dissection, the peritoneum and preperitoneal fat in the midline were further preserved below the hernia as they were dissected  off of the overlying linea alba. At the costal margin the posterior rectus sheath was divided in order to drop out of the retromuscular plane into the preperitoneal plane in order to obtain mesh underlap above the costal margin.    The hernia defect area was now visualized fully.  Tension on the peritoneal and posterior rectus fascia closure was assessed.  The goal was for a tension free closure to avoid postoperative intraparietal hernia, and sufficient retromuscular mesh overlap.  To acchieve these goals, and to reach the right sided flank hernia I decided bilateral transversus abdominis release was necessary.    A transversus abdominis release (TAR) was performed on the RIGHT side.  The transversus abdominis muscle was identified deep to the posterior rectus sheath and incised vertically along its entire length, entering the pre-peritoneal or pre-transversalis fascia plane.  This disinserted the transversus abdominis muscle from the linea semilunaris.  Since the intercostal nerves, arteries and veins had been preserved during the rectus myofascial release portion of the procedure, they remained intact during the TAR. The peritoneum was subsequently peeled away from the underside of the divided transversus abdominis muscle.  This dissection was carried out laterally towards the retroperitoneum.  The TAR accomplished additional medialization of the posterior rectus sheath with its attached peritoneum towards the midline to allow for visceral sac closure.  The TAR also provided further offset of tension of the rectus muscle flap with additional transfer of the rectus muscle towards the midline, as it remained attached to the external and internal abdominal oblique muscles.  This will allow for future inset of the medial aspect of the flap for abdominal wall reconstruction.   During the right sided dissection, the flank hernia was identified.  The patient was placed in left side down positioning.  Even with the  bed tilted, it was difficult to fully visualize this space.  I put a fourth robotic arm through the assistant trocar for this dissection to help with retraction.  The hernia contents were able to be fully reduced and the dissection was carried past the hernia defect.  The defect was closed from its lateral aspect working medially using #1 Ethicon Stratafix Symmetric PDS Plus suture.  I continued this closure to the linea alba to reinforce the muscles previously weakened by the liver transplant surgical incision.   A transversus abdominis release (TAR) was performed on the LEFT side.  The transversus abdominis muscle was identified deep to the posterior rectus sheath and incised vertically along its entire length, entering the pre-peritoneal or pre-transversalis fascia plane.  This disinserted the transversus abdominis muscle from the linea semilunaris.  Since the intercostal nerves, arteries and veins had been preserved during the rectus myofascial release portion of the procedure, they remained intact during the TAR. The peritoneum was subsequently peeled away from the underside of the divided transversus abdominis muscle.  This dissection was carried out laterally towards the retroperitoneum.  The TAR accomplished additional medialization of the posterior rectus sheath with its attached peritoneum towards the midline to allow for visceral sac closure.  The TAR also provided further offset of tension of the rectus muscle flap with additional transfer of the rectus muscle towards the midline, as it remained attached to the external and internal abdominal oblique muscles.  This will  allow for future inset of the medial aspect of the flap for abdominal wall reconstruction.   With the myofascial releases complete, flank hernia closed and the posterior layer reconstructed with a vicryl mesh patch as described above, we directed attention to closure of the midline fascial defect.    The midline hernia defect was  closed utilizing a continuous, #1 Ethicon Stratafix Symmetric PDS Plus suture.  The hernia defect, and subsequently the rectus musculature, came together well for a complete abdominal wall reconstruction.  The dissected out retrorectus space was measured with a metric ruler so as to determine the size of the proposed mesh.    A piece of Bard Soft mesh was opened and trimmed to 38 x 38 cm. The mesh was advanced into the retrorectus space and the mesh positioned flat against the intact posterior rectus sheaths. The mesh was fixated with a 0 ethibond transfascial suture lateral to the flank hernia closure.  The mesh was fixated with an 0 ethibond suture to the xiphoid.  The mesh was not fixated elsewhere as it occupied the entire retromuscular plane, and also covered all of the trocars.   The robot was undocked and the laparoscope was inserted, inspecting for hemostasis.  The mesh deployment was performed laparoscopically.  Laparoscopic Portion:  A transversus abdominis plane (TAP) block was performed bilaterally with a mixture of marcaine and Exparel.  The anesthetic was first injected into the plane between the transversus abdominis and internal abdominal oblique muscles on the left. The TAP was repeated on the contralateral side.    The assistant trocar was closed with a figure of 8 vicryl suture.  The trocars were removed and the skin closed with 4-0 Monocryl subcuticular sutures and skin glue.   Teddie Favre, MD General, Bariatric, & Minimally Invasive Surgery [facility], [place]

## 2023-09-17 NOTE — H&P (Signed)
 Admitting Physician: Avon Boers Harshal Sirmon  Service: General Surgery  CC: Hernia  Subjective   HPI: Elizabeth Norton is an 48 y.o. female who is here for hernia repair  Past Medical History:  Diagnosis Date   Arthritis    Cancer (HCC)    Cirrhosis of liver (HCC)    Depression    Dysrhythmia    tachycardia   High cholesterol    Hypertension     Past Surgical History:  Procedure Laterality Date   CHOLECYSTECTOMY     gallbladder removed when liver transplant was done   INCISIONAL HERNIA REPAIR N/A 03/04/2023   Procedure: INCARCERATED INCISIONAL HERNIA REPAIR;  Surgeon: Oralee Billow, MD;  Location: [facility];  Service: General;  Laterality: N/A;   LIVER TRANSPLANT  07/11/2020   done at [facility] in [place]   right wrist fracture Right 2006   ORIF  then screws removed after bone healed   WISDOM TOOTH EXTRACTION      Family History  Problem Relation Age of Onset   Hyperthyroidism Mother    Hyperthyroidism Sister    Hyperthyroidism Brother     Social:  reports that she has never smoked. She has never used smokeless tobacco. She reports current alcohol use. She reports that she does not use drugs.  Allergies:  Allergies  Allergen Reactions   Almond Oil Anaphylaxis and Swelling    Other reaction(s): Lip swelling, Swelling around eyes   Penicillins Hives    Had as a child Has patient had a PCN reaction causing immediate rash, facial/tongue/throat swelling, SOB or lightheadedness with hypotension: No Has patient had a PCN reaction causing severe rash involving mucus membranes or skin necrosis: No Has patient had a PCN reaction that required hospitalization: No Has patient had a PCN reaction occurring within the last 10 years: No If all of the above answers are NO, then may proceed with Cephalosporin use.   Latex Itching    Medications: Current Outpatient Medications  Medication Instructions   Aspirin  Low Dose 81 mg, Oral, Daily   tacrolimus  ER (ENVARSUS   XR) 1 MG TB24 Take 1 tablet (1 mg total) by mouth daily before breakfast.   tacrolimus  ER (ENVARSUS  XR) 4 MG TB24 Take 1 tablet (4 mg total) by mouth every morning before breakfast.   Vitamin D  (Ergocalciferol ) (DRISDOL ) 50,000 Units, Oral, Every 7 days    ROS - all of the below systems have been reviewed with the patient and positives are indicated with bold text General: chills, fever or night sweats Eyes: blurry vision or double vision ENT: epistaxis or sore throat Allergy/Immunology: itchy/watery eyes or nasal congestion Hematologic/Lymphatic: bleeding problems, blood clots or swollen lymph nodes Endocrine: temperature intolerance or unexpected weight changes Breast: new or changing breast lumps or nipple discharge Resp: cough, shortness of breath, or wheezing CV: chest pain or dyspnea on exertion GI: as per HPI GU: dysuria, trouble voiding, or hematuria MSK: joint pain or joint stiffness Neuro: TIA or stroke symptoms Derm: pruritus and skin lesion changes Psych: anxiety and depression  Objective   PE Blood pressure (!) 137/98, temperature 97.9 F (36.6 C), temperature source Oral, resp. rate 16, height 5' 6 (1.676 m), weight 112.5 kg, last menstrual period 12/12/2016, SpO2 100%. Constitutional: NAD; conversant; no deformities Eyes: Moist conjunctiva; no lid lag; anicteric; PERRL Neck: Trachea midline; no thyromegaly Lungs: Normal respiratory effort; no tactile fremitus CV: RRR; no palpable thrills; no pitting edema GI: Abd large midline and right subcostal incisions from previous liver transplant with  multiple hernia defects MSK: Normal range of motion of extremities; no clubbing/cyanosis Psychiatric: Appropriate affect; alert and oriented x3 Lymphatic: No palpable cervical or axillary lymphadenopathy  No results found for this or any previous visit (from the past 24 hours).  Imaging Orders  No imaging studies ordered today   CT Abd/Pel 01/31/23  6.4 cm wide by 21.3  cm tall midline incisional hernia  5.6 cm tall by 4.2 cm wide right lateral incisional hernia  1. Multiple supraumbilical hernias, most of which are small and contain only fat; however, the largest ventral hernia extends through a 6.0 cm neck and contains a loop of unobstructed transverse colon. 2. Right lumbar hernia extending through a 3.6 cm neck containing fat without evidence of incarceration.   Assessment and Plan   [name] has two hernias following transplant surgery, one 6.4 cm wide by 21.3 cm tall midline incisional hernia and one 5.6 cm tall by 4.2 cm wide right lateral incisional hernia.   I discussed the pathophysiology of hernias with the patient. She does have some symptoms, though she does not have concerning obstructive symptoms. On CT the hernias are mostly fat-containing, however there is one loop of intestine and a broad based hernia in the upper midline. I feel her risk of acute hernia emergency is very low. I explained my approach for hernia repair would be robotic incisional hernia repair with mesh with bilateral posterior rectus myofascial release, right sided and possibly left-sided transversus abdominis release. I explained the procedure itself as well as its risk, benefits, and alternatives. The patient has insurance constraints and would like her surgery done at [facility] to stay in network. During my training with transplant surgeons, I know transplant surgery patients are often recommended to have all of their surgeries at the transplant center in the future if possible. I will reach out to her transplant team to discuss her case. If they feel it would be best for her to have surgery at [facility], I can discussed this with the insurance plan to explain the situation. If they feel comfortable with her having surgery at [facility], we will work toward scheduling surgery.  CT was reviewed, please see representative images above. I reviewed Dr. Bridgett Camps operative notes from  the time of liver transplant. I reviewed Dr. Colman Deans' liver transplant medicine follow-up notes, it appears she did miss her April 2024 appointment, which she says was because she had just started a new job.  Notes from today 06/12/23: I recommended waiting at least 6 months from her previous surgery prior to attempting to address her entire abdominal wall with a robotic mesh repair. I recommended robotic incisional hernia repair with mesh with bilateral posterior rectus myofascial release, right sided and possible left-sided transversus abdominis release. I explained surgery itself as well as its risk, benefits, and alternatives. I explained the need to wait 6 months to allow her recent surgery to heal before attempting recurrent surgery. We discussed the procedure itself as well as its risk, benefits, and alternatives. After full discussion and all questions answered the patient granted consent to proceed. Our surgery scheduler will reach out to the patient to work on scheduling surgery. I would like to get another preoperative CT scan as it feels her right sided hernia that was repaired in November may already have recurred. This will help with surgical planning.   Notes from today.   We again discussed the procedure, its risks, benefits and alternatives and the patient granted consent to proceed.  Will proceed as scheduled.   Junie Olds, MD  [facility] Use [email or web address] to contact on call provider

## 2023-09-17 NOTE — Anesthesia Postprocedure Evaluation (Signed)
 Anesthesia Post Note  Patient: Elizabeth Norton  Procedure(s) Performed: REPAIR, HERNIA, VENTRAL, ROBOT-ASSISTED     Patient location during evaluation: PACU Anesthesia Type: General Level of consciousness: awake and alert Pain management: pain level controlled Vital Signs Assessment: post-procedure vital signs reviewed and stable Respiratory status: spontaneous breathing, nonlabored ventilation and respiratory function stable Cardiovascular status: blood pressure returned to baseline Postop Assessment: no apparent nausea or vomiting Anesthetic complications: no   No notable events documented.  Last Vitals:  Vitals:   09/17/23 1645 09/17/23 1729  BP: (!) 123/99 (!) 129/93  Pulse: (!) 101 87  Resp: 16 18  Temp: 36.7 C 36.7 C  SpO2: 96% 96%    Last Pain:  Vitals:   09/17/23 1729  TempSrc: Oral  PainSc:                  Rayfield Cairo

## 2023-09-17 NOTE — Plan of Care (Signed)
   Problem: Education: Goal: Knowledge of General Education information will improve Description Including pain rating scale, medication(s)/side effects and non-pharmacologic comfort measures Outcome: Progressing

## 2023-09-18 ENCOUNTER — Encounter (HOSPITAL_COMMUNITY): Payer: Self-pay | Admitting: Surgery

## 2023-09-18 DIAGNOSIS — K439 Ventral hernia without obstruction or gangrene: Secondary | ICD-10-CM | POA: Diagnosis not present

## 2023-09-18 DIAGNOSIS — K432 Incisional hernia without obstruction or gangrene: Secondary | ICD-10-CM | POA: Diagnosis not present

## 2023-09-18 DIAGNOSIS — Z79899 Other long term (current) drug therapy: Secondary | ICD-10-CM | POA: Diagnosis not present

## 2023-09-18 DIAGNOSIS — Z944 Liver transplant status: Secondary | ICD-10-CM | POA: Diagnosis not present

## 2023-09-18 DIAGNOSIS — Z91040 Latex allergy status: Secondary | ICD-10-CM | POA: Diagnosis not present

## 2023-09-18 DIAGNOSIS — Z7982 Long term (current) use of aspirin: Secondary | ICD-10-CM | POA: Diagnosis not present

## 2023-09-18 DIAGNOSIS — I10 Essential (primary) hypertension: Secondary | ICD-10-CM | POA: Diagnosis not present

## 2023-09-18 LAB — CBC
HCT: 31.5 % — ABNORMAL LOW (ref 36.0–46.0)
Hemoglobin: 10.3 g/dL — ABNORMAL LOW (ref 12.0–15.0)
MCH: 36.3 pg — ABNORMAL HIGH (ref 26.0–34.0)
MCHC: 32.7 g/dL (ref 30.0–36.0)
MCV: 110.9 fL — ABNORMAL HIGH (ref 80.0–100.0)
Platelets: 122 10*3/uL — ABNORMAL LOW (ref 150–400)
RBC: 2.84 MIL/uL — ABNORMAL LOW (ref 3.87–5.11)
RDW: 16.4 % — ABNORMAL HIGH (ref 11.5–15.5)
WBC: 5.8 10*3/uL (ref 4.0–10.5)
nRBC: 0 % (ref 0.0–0.2)

## 2023-09-18 LAB — BASIC METABOLIC PANEL WITH GFR
Anion gap: 9 (ref 5–15)
BUN: 22 mg/dL — ABNORMAL HIGH (ref 6–20)
CO2: 21 mmol/L — ABNORMAL LOW (ref 22–32)
Calcium: 7.4 mg/dL — ABNORMAL LOW (ref 8.9–10.3)
Chloride: 102 mmol/L (ref 98–111)
Creatinine, Ser: 1.21 mg/dL — ABNORMAL HIGH (ref 0.44–1.00)
GFR, Estimated: 55 mL/min — ABNORMAL LOW (ref >60)
Glucose, Bld: 144 mg/dL — ABNORMAL HIGH (ref 70–99)
Potassium: 4.8 mmol/L (ref 3.5–5.1)
Sodium: 132 mmol/L — ABNORMAL LOW (ref 135–145)

## 2023-09-18 MED ORDER — LACTATED RINGERS IV SOLN: INTRAVENOUS | Status: AC

## 2023-09-18 NOTE — Progress Notes (Signed)
 Progress Note: General Surgery Service   Chief Complaint/Subjective: Doing okay today.  Pain as expected  Objective: Vital signs in last 24 hours: Temp:  [97.8 F (36.6 C)-98.7 F (37.1 C)] 97.9 F (36.6 C) (06/12 0535) Pulse Rate:  [79-101] 100 (06/12 0535) Resp:  [8-18] 18 (06/12 0535) BP: (102-129)/(66-99) 107/66 (06/12 0535) SpO2:  [91 %-100 %] 99 % (06/12 0535) Arterial Line BP: (111-127)/(68-73) 111/69 (06/11 1600) Last BM Date : 09/16/23  Intake/Output from previous day: 06/11 0701 - 06/12 0700 In: 4198 [P.O.:620; I.V.:3578] Out: 1030 [Urine:1000; Blood:30] Intake/Output this shift: No intake/output data recorded.  GI: Abd soft, incisions c/d/I w/ glue, tender  Lab Results: CBC  Recent Labs    09/17/23 1653 09/18/23 0501  WBC 7.6 5.8  HGB 11.5* 10.3*  HCT 34.0* 31.5*  PLT 140* 122*   BMET Recent Labs    09/17/23 0810 09/17/23 1653 09/18/23 0501  NA 137  --  132*  K 4.4  --  4.8  CL 106  --  102  CO2 23  --  21*  GLUCOSE 120*  --  144*  BUN 13  --  22*  CREATININE 0.69 0.92 1.21*  CALCIUM 8.6*  --  7.4*   PT/INR Recent Labs    09/17/23 0810  LABPROT 15.3*  INR 1.2   ABG No results for input(s): PHART, HCO3 in the last 72 hours.  Invalid input(s): PCO2, PO2  Anti-infectives: Anti-infectives (From admission, onward)    Start     Dose/Rate Route Frequency Ordered Stop   09/17/23 0630  clindamycin (CLEOCIN) IVPB 900 mg        900 mg 100 mL/hr over 30 Minutes Intravenous On call to O.R. 09/17/23 0620 09/17/23 0900       Medications: Scheduled Meds:  aspirin  EC  81 mg Oral Daily   docusate sodium   100 mg Oral BID   enoxaparin (LOVENOX) injection  40 mg Subcutaneous Q24H   tacrolimus  ER  5 mg Oral QAC breakfast   Continuous Infusions:  lactated ringers  Stopped (09/18/23 0537)   PRN Meds:.HYDROmorphone  (DILAUDID ) injection, methocarbamol  (ROBAXIN ) injection, ondansetron  (ZOFRAN ) IV, oxyCODONE , oxyCODONE , prochlorperazine,  simethicone  Assessment/Plan: Elizabeth Norton is s/p  Robotic incisional (midline and lateral) hernia repair with mesh Bilateral posterior rectus myofascial release Bilateral transversus abdominis release On 09/17/23  Doing well POD1 AKI likely related to prolonged surgery Cr 1.2, baseline 0.7 - continue IVF today, recheck in AM, monitor UOP Increase activity   LOS: 0 days    Junie Olds, MD  [facility] Use [email or web address] to contact on call provider  Daily Billing: [id number] - post op

## 2023-09-18 NOTE — Progress Notes (Signed)
   09/18/23 1035  TOC Brief Assessment  Insurance and Status Reviewed  Patient has primary care physician Yes  Home environment has been reviewed home with spouse  Prior level of function: independent  Prior/Current Home Services No current home services  Social Drivers of Health Review SDOH reviewed no interventions necessary  Readmission risk has been reviewed Yes  Transition of care needs no transition of care needs at this time

## 2023-09-18 NOTE — Plan of Care (Signed)

## 2023-09-19 ENCOUNTER — Other Ambulatory Visit (HOSPITAL_COMMUNITY): Payer: Self-pay

## 2023-09-19 DIAGNOSIS — Z944 Liver transplant status: Secondary | ICD-10-CM | POA: Diagnosis not present

## 2023-09-19 DIAGNOSIS — K432 Incisional hernia without obstruction or gangrene: Secondary | ICD-10-CM | POA: Diagnosis not present

## 2023-09-19 DIAGNOSIS — Z79899 Other long term (current) drug therapy: Secondary | ICD-10-CM | POA: Diagnosis not present

## 2023-09-19 DIAGNOSIS — Z91040 Latex allergy status: Secondary | ICD-10-CM | POA: Diagnosis not present

## 2023-09-19 DIAGNOSIS — K439 Ventral hernia without obstruction or gangrene: Secondary | ICD-10-CM | POA: Diagnosis not present

## 2023-09-19 DIAGNOSIS — I10 Essential (primary) hypertension: Secondary | ICD-10-CM | POA: Diagnosis not present

## 2023-09-19 DIAGNOSIS — Z7982 Long term (current) use of aspirin: Secondary | ICD-10-CM | POA: Diagnosis not present

## 2023-09-19 LAB — CBC
HCT: 29.1 % — ABNORMAL LOW (ref 36.0–46.0)
Hemoglobin: 9.7 g/dL — ABNORMAL LOW (ref 12.0–15.0)
MCH: 37.2 pg — ABNORMAL HIGH (ref 26.0–34.0)
MCHC: 33.3 g/dL (ref 30.0–36.0)
MCV: 111.5 fL — ABNORMAL HIGH (ref 80.0–100.0)
Platelets: 103 10*3/uL — ABNORMAL LOW (ref 150–400)
RBC: 2.61 MIL/uL — ABNORMAL LOW (ref 3.87–5.11)
RDW: 16.9 % — ABNORMAL HIGH (ref 11.5–15.5)
WBC: 7.9 10*3/uL (ref 4.0–10.5)
nRBC: 0 % (ref 0.0–0.2)

## 2023-09-19 LAB — BASIC METABOLIC PANEL WITH GFR
Anion gap: 6 (ref 5–15)
BUN: 23 mg/dL — ABNORMAL HIGH (ref 6–20)
CO2: 22 mmol/L (ref 22–32)
Calcium: 7.3 mg/dL — ABNORMAL LOW (ref 8.9–10.3)
Chloride: 99 mmol/L (ref 98–111)
Creatinine, Ser: 1.04 mg/dL — ABNORMAL HIGH (ref 0.44–1.00)
GFR, Estimated: >60 mL/min (ref >60)
Glucose, Bld: 169 mg/dL — ABNORMAL HIGH (ref 70–99)
Potassium: 3.7 mmol/L (ref 3.5–5.1)
Sodium: 127 mmol/L — ABNORMAL LOW (ref 135–145)

## 2023-09-19 MED ORDER — GABAPENTIN 100 MG PO CAPS
100.0000 mg | ORAL_CAPSULE | Freq: Three times a day (TID) | ORAL | 2 refills | Status: DC | PRN
  Filled 2023-09-19: qty 30, 10d supply, fill #0

## 2023-09-19 MED ORDER — METHOCARBAMOL 500 MG PO TABS
500.0000 mg | ORAL_TABLET | Freq: Four times a day (QID) | ORAL | 0 refills | Status: DC | PRN
  Filled 2023-09-19: qty 30, 8d supply, fill #0

## 2023-09-19 MED ORDER — OXYCODONE-ACETAMINOPHEN 5-325 MG PO TABS
1.0000 | ORAL_TABLET | ORAL | 0 refills | Status: DC | PRN
  Filled 2023-09-19: qty 20, 4d supply, fill #0

## 2023-09-19 NOTE — Discharge Summary (Signed)
  Patient ID: Elizabeth Norton [id number] 48 y.o. 05-19-1975  09/17/2023  Discharge date and time: 09/19/2023  Admitting Physician: Avon Boers Dijuan Sleeth  Discharge Physician: Avon Boers Ege Muckey  Admission Diagnoses: Incisional hernia, without obstruction or gangrene [K43.2] Ventral hernia without obstruction or gangrene [K43.9] Incisional hernia following transplant [K43.2, Z94.9] Incisional hernia [K43.2] Patient Active Problem List   Diagnosis Date Noted   Incisional hernia 09/17/2023   High cholesterol    Fibromyalgia    Depression    History of liver transplant (HCC) 12/04/2022   Ventral hernia without obstruction or gangrene 12/04/2022     Discharge Diagnoses:  Patient Active Problem List   Diagnosis Date Noted   Incisional hernia 09/17/2023   High cholesterol    Fibromyalgia    Depression    History of liver transplant (HCC) 12/04/2022   Ventral hernia without obstruction or gangrene 12/04/2022    Operations: Procedure(s): REPAIR, HERNIA, VENTRAL, ROBOT-ASSISTED  Admission Condition: good  Discharged Condition: good  Indication for Admission: hernia  Hospital Course:  Robotic incisional (midline and lateral) hernia repair with mesh Bilateral posterior rectus myofascial release Bilateral transversus abdominis release On 09/17/23  Consults: None  Significant Diagnostic Studies: None  Treatments: surgery: as above  Disposition: Home  Patient Instructions:  Allergies as of 09/19/2023       Reactions   Almond Oil Anaphylaxis, Swelling   Other reaction(s): Lip swelling, Swelling around eyes   Penicillins Hives   Had as a child Has patient had a PCN reaction causing immediate rash, facial/tongue/throat swelling, SOB or lightheadedness with hypotension: No Has patient had a PCN reaction causing severe rash involving mucus membranes or skin necrosis: No Has patient had a PCN reaction that required hospitalization: No Has patient had a PCN reaction  occurring within the last 10 years: No If all of the above answers are NO, then may proceed with Cephalosporin use.   Latex Itching        Medication List     TAKE these medications    Aspirin  Low Dose 81 MG tablet Generic drug: aspirin  EC Take 1 tablet (81 mg total) by mouth daily.   Envarsus  XR 1 MG Tb24 Generic drug: tacrolimus  ER Take 1 tablet (1 mg total) by mouth daily before breakfast.   Envarsus  XR 4 MG Tb24 Generic drug: tacrolimus  ER Take 1 tablet (4 mg total) by mouth every morning before breakfast.   gabapentin  100 MG capsule Commonly known as: Neurontin  Take 1 capsule (100 mg total) by mouth 3 (three) times daily as needed (Nerve pain).   methocarbamol  500 MG tablet Commonly known as: ROBAXIN  Take 1 tablet (500 mg total) by mouth every 6 (six) hours as needed for muscle spasms.   oxyCODONE -acetaminophen  5-325 MG tablet Commonly known as: Percocet Take 1 tablet by mouth every 4 (four) hours as needed for severe pain (pain score 7-10).   Vitamin D  (Ergocalciferol ) 1.25 MG (50000 UNIT) Caps capsule Commonly known as: DRISDOL  Take 1 capsule (50,000 Units total) by mouth every 7 (seven) days.        Activity: no heavy lifting for 4 weeks Diet: regular diet Wound Care: keep wound clean and dry  Follow-up:  With Dr. Marny Sires  Signed: Avon Boers [facility] [place]   09/19/2023, 8:30 AM

## 2023-09-19 NOTE — Progress Notes (Signed)
 TOC meds in a secure bag delivered to pt in room by this RN.

## 2023-09-19 NOTE — Discharge Instructions (Signed)

## 2023-09-26 ENCOUNTER — Other Ambulatory Visit (HOSPITAL_BASED_OUTPATIENT_CLINIC_OR_DEPARTMENT_OTHER): Payer: Self-pay

## 2023-09-26 MED ORDER — METHOCARBAMOL 500 MG PO TABS
500.0000 mg | ORAL_TABLET | Freq: Three times a day (TID) | ORAL | 0 refills | Status: AC
  Filled 2023-09-26: qty 30, 10d supply, fill #0

## 2023-09-26 MED ORDER — OXYCODONE-ACETAMINOPHEN 5-325 MG PO TABS
1.0000 | ORAL_TABLET | Freq: Four times a day (QID) | ORAL | 0 refills | Status: AC | PRN
  Filled 2023-09-26: qty 15, 4d supply, fill #0

## 2023-09-26 MED ORDER — GABAPENTIN 100 MG PO CAPS
100.0000 mg | ORAL_CAPSULE | Freq: Every day | ORAL | 11 refills | Status: DC
Start: 2023-09-26 — End: 2023-12-05
  Filled 2023-09-26: qty 90, 90d supply, fill #0

## 2023-10-03 ENCOUNTER — Other Ambulatory Visit (HOSPITAL_BASED_OUTPATIENT_CLINIC_OR_DEPARTMENT_OTHER): Payer: Self-pay

## 2023-10-03 MED ORDER — OXYCODONE-ACETAMINOPHEN 5-325 MG PO TABS
1.0000 | ORAL_TABLET | Freq: Four times a day (QID) | ORAL | 0 refills | Status: AC | PRN
  Filled 2023-10-03: qty 20, 5d supply, fill #0

## 2023-10-14 ENCOUNTER — Other Ambulatory Visit (HOSPITAL_COMMUNITY): Payer: Self-pay

## 2023-10-14 ENCOUNTER — Other Ambulatory Visit (HOSPITAL_BASED_OUTPATIENT_CLINIC_OR_DEPARTMENT_OTHER): Payer: Self-pay

## 2023-10-14 MED ORDER — OXYCODONE-ACETAMINOPHEN 5-325 MG PO TABS
1.0000 | ORAL_TABLET | Freq: Four times a day (QID) | ORAL | 0 refills | Status: DC | PRN
  Filled 2023-10-14: qty 20, 5d supply, fill #0

## 2023-10-14 NOTE — Progress Notes (Signed)
   PROVIDER:  DEWARD PURCHASE STECHSCHULTE, MD  MRN: [id number] DOB: [birth year] DATE OF ENCOUNTER: 10/14/2023 Interval History:     History of Present Illness Elizabeth Norton is a 48 year old female who presents with postoperative pain following hernia surgery.  She experiences significant pain in the areas covered by the surgical mesh, describing it as 'right up here, right over here, and for some reason right here.' The pain persists six months post-surgery and worsens with certain movements or twists. She uses Percocet at night to manage the pain and aid sleep, as it is the only medication that provides relief. She also takes Tylenol  but is cautious about overuse due to concerns about liver health. She has been using a heating pad but not cold packs.       Physical Examination:   Physical Exam   Abdomen - incisions c/d/I, some hematoma/seroma along midline incisional hernias   Assessment and Plan:     Elizabeth Norton is a 48 y.o. female who underwent robotic hernia reapir on 09/17/23.  Diagnoses and all orders for this visit:  Postoperative state  Other orders -     oxyCODONE -acetaminophen  (PERCOCET) 5-325 mg tablet; Take 1 tablet by mouth every 6 (six) hours as needed for up to 5 days      Assessment & Plan Incisional hernia with seroma Post-surgical seroma formation in the area of the incisional hernia repair, likely due to blood or fluid collection above the muscle and beneath the skin, causing prolonged discomfort. Expected to resolve spontaneously over time without intervention. - Advise alternating heat and cold packs to aid in seroma resolution.  Postoperative pain Persistent postoperative pain following incisional hernia repair, exacerbated by movement. Managed with Percocet, providing relief, especially at night. Cautious about overusing Tylenol  due to liver concerns. - Prescribe Percocet for pain management, to be sent to the pharmacy at [facility]. - Advise on the  use of Tylenol  with caution due to liver health.  Nerve pain post-surgery Nerve pain characterized by numbness and pins and needles sensation in the leg, likely due to nerve irritation during surgery. Gabapentin  has been used with limited efficacy. Nerve healing is slow, with expected improvement over time. - Continue gabapentin  for nerve pain management. - Educate on the slow healing process of nerves and the expected progression from numbness to paresthesias to normal sensation.      Return if symptoms worsen or fail to improve.  This note has been created using automated tools and reviewed for accuracy by PAUL JEFFREY STECHSCHULTE.   The plan was discussed in detail with the patient today, who expressed understanding.  The patient has my contact information, and understands to call me with any additional questions or concerns in the interval.  I would be happy to see the patient back sooner if the need arises.   PAUL JEFFREY STECHSCHULTE, MD

## 2023-10-30 ENCOUNTER — Other Ambulatory Visit: Payer: Self-pay

## 2023-11-03 ENCOUNTER — Other Ambulatory Visit: Payer: Self-pay

## 2023-11-03 NOTE — Progress Notes (Signed)
 Specialty Pharmacy Refill Coordination Note  Elizabeth Norton is a 49 y.o. female contacted today regarding refills of specialty medication(s) Tacrolimus  (Envarsus  XR)   Patient requested [name] at [facility] [place] date: 11/05/23   Medication will be filled on 11/05/23.

## 2023-11-04 ENCOUNTER — Other Ambulatory Visit: Payer: Self-pay

## 2023-11-10 ENCOUNTER — Other Ambulatory Visit (HOSPITAL_COMMUNITY): Payer: Self-pay

## 2023-11-18 ENCOUNTER — Other Ambulatory Visit (HOSPITAL_COMMUNITY): Payer: Self-pay

## 2023-11-18 NOTE — Progress Notes (Addendum)
 Returned to [employer]. Patient never picked up. Left voicemail on 11/18/23. A one-time refill occurrence outreach has been created. Attempts will be made again beginning 11/25/23.

## 2023-11-25 ENCOUNTER — Other Ambulatory Visit (HOSPITAL_COMMUNITY): Payer: Self-pay

## 2023-11-26 ENCOUNTER — Encounter: Admitting: Emergency Medicine

## 2023-11-27 ENCOUNTER — Other Ambulatory Visit: Payer: Self-pay

## 2023-12-01 ENCOUNTER — Other Ambulatory Visit: Payer: Self-pay

## 2023-12-02 ENCOUNTER — Other Ambulatory Visit: Payer: Self-pay

## 2023-12-04 ENCOUNTER — Encounter: Admitting: Emergency Medicine

## 2023-12-04 ENCOUNTER — Other Ambulatory Visit: Payer: Self-pay

## 2023-12-05 ENCOUNTER — Encounter: Admitting: Family Medicine

## 2023-12-05 ENCOUNTER — Other Ambulatory Visit (HOSPITAL_COMMUNITY): Payer: Self-pay

## 2023-12-05 ENCOUNTER — Ambulatory Visit (INDEPENDENT_AMBULATORY_CARE_PROVIDER_SITE_OTHER): Admitting: Internal Medicine

## 2023-12-05 ENCOUNTER — Encounter: Payer: Self-pay | Admitting: Internal Medicine

## 2023-12-05 VITALS — BP 122/84 | HR 91 | Temp 98.1°F | Ht 66.0 in | Wt 241.2 lb

## 2023-12-05 DIAGNOSIS — E669 Obesity, unspecified: Secondary | ICD-10-CM

## 2023-12-05 DIAGNOSIS — F32A Depression, unspecified: Secondary | ICD-10-CM | POA: Diagnosis not present

## 2023-12-05 DIAGNOSIS — Z01419 Encounter for gynecological examination (general) (routine) without abnormal findings: Secondary | ICD-10-CM

## 2023-12-05 DIAGNOSIS — E7800 Pure hypercholesterolemia, unspecified: Secondary | ICD-10-CM | POA: Diagnosis not present

## 2023-12-05 DIAGNOSIS — Z0000 Encounter for general adult medical examination without abnormal findings: Secondary | ICD-10-CM

## 2023-12-05 DIAGNOSIS — Z23 Encounter for immunization: Secondary | ICD-10-CM | POA: Diagnosis not present

## 2023-12-05 DIAGNOSIS — E871 Hypo-osmolality and hyponatremia: Secondary | ICD-10-CM

## 2023-12-05 DIAGNOSIS — R7303 Prediabetes: Secondary | ICD-10-CM

## 2023-12-05 DIAGNOSIS — E559 Vitamin D deficiency, unspecified: Secondary | ICD-10-CM | POA: Diagnosis not present

## 2023-12-05 DIAGNOSIS — D649 Anemia, unspecified: Secondary | ICD-10-CM | POA: Diagnosis not present

## 2023-12-05 DIAGNOSIS — E119 Type 2 diabetes mellitus without complications: Secondary | ICD-10-CM | POA: Insufficient documentation

## 2023-12-05 DIAGNOSIS — E538 Deficiency of other specified B group vitamins: Secondary | ICD-10-CM

## 2023-12-05 DIAGNOSIS — Z0001 Encounter for general adult medical examination with abnormal findings: Secondary | ICD-10-CM | POA: Insufficient documentation

## 2023-12-05 MED ORDER — PHENTERMINE HCL 37.5 MG PO CAPS
37.5000 mg | ORAL_CAPSULE | ORAL | 1 refills | Status: DC
  Filled 2023-12-05: qty 90, 90d supply, fill #0

## 2023-12-05 NOTE — Assessment & Plan Note (Signed)
 Last vitamin D  Lab Results  Component Value Date   VD25OH 17.96 (L) 07/31/2023   Low, to start oral replacement

## 2023-12-05 NOTE — Patient Instructions (Signed)
 You had the Prevnar 20 pneumonia shot today  Please remember to have your flu shot next month  Please take all new medication as prescribed - the phentermine  37.5 mg daily  Please continue all other medications as before, and refills have been done if requested.  Please have the pharmacy call with any other refills you may need.  Please continue your efforts at being more active, low cholesterol diet, and weight control.  You are otherwise up to date with prevention measures today.  Please keep your appointments with your specialists as you may have planned  You will be contacted regarding the referral for: [facility]  Please go to the LAB at the blood drawing area for the tests to be done  You will be contacted by phone if any changes need to be made immediately.  Otherwise, you will receive a letter about your results with an explanation, but please check with MyChart first.  Please make an Appointment to return for your 1 year visit, or sooner if needed

## 2023-12-05 NOTE — Assessment & Plan Note (Signed)
 Mild 127 and liekly improved, will check f/u lab

## 2023-12-05 NOTE — Assessment & Plan Note (Signed)
 Stable overall, no indication for tx at this time

## 2023-12-05 NOTE — Assessment & Plan Note (Signed)
 Age and sex appropriate education and counseling updated with regular exercise and diet Referrals for preventative services - for GYN referral Immunizations addressed - for Prevnar 20 today Smoking counseling  - none needed Evidence for depression or other mood disorder - none significant Most recent labs reviewed. I have personally reviewed and have noted: 1) the patient's medical and social history 2) The patient's current medications and supplements 3) The patient's height, weight, and BMI have been recorded in the chart

## 2023-12-05 NOTE — Progress Notes (Signed)
 Patient ID: Elizabeth Norton, female   DOB: 1976-01-14, 48 y.o.   MRN: [id number]         Chief Complaint:: wellness exam with PCP not available, low sodium, hyperglycemia, anemia, low vit d, and obesity       HPI:  Elizabeth Norton is a 48 y.o. female here for wellness exam; due for GYN referral, due for Prevnar 20 o/w up to date                        Also Pt denies chest pain, increased sob or doe, wheezing, orthopnea, PND, increased LE swelling, palpitations, dizziness or syncope.   Pt denies polydipsia, polyuria, or new focal neuro s/s.    Pt denies fever, wt loss, night sweats, loss of appetite, or other constitutional symptoms  Had recent double hernia repair 2 mo ago with postop anemia, mild low sodium, hyperglycemia noted  Difficult to lose wt with diet and little time for exercise.     Wt Readings from Last 3 Encounters:  12/05/23 241 lb 3.2 oz (109.4 kg)  09/17/23 248 lb (112.5 kg)  09/05/23 248 lb (112.5 kg)   BP Readings from Last 3 Encounters:  12/05/23 122/84  09/19/23 117/76  09/05/23 (!) 122/92   Immunization History  Administered Date(s) Administered   Hep A / Hep B 07/06/2021   Hepatitis B 08/07/2020   Influenza, Seasonal, Injecte, Preservative Fre 12/04/2022   Influenza,inj,Quad PF,6+ Mos 01/19/2020, 01/08/2021, 01/24/2022   Influenza,inj,Quad PF,6-35 Mos 01/19/2020   Moderna Sars-Covid-2 Vaccination 10/28/2019, 11/26/2019, 05/31/2020   Tdap 07/06/2021   Health Maintenance Due  Topic Date Due   Diabetic kidney evaluation - Urine ACR  Never done   Pneumococcal Vaccine (1 of 2 - PCV) Never done   Cervical Cancer Screening (HPV/Pap Cotest)  Never done   Hepatitis B Vaccines 19-59 Average Risk (3 of 3 - 19+ 3-dose series) 08/31/2021   HEMOGLOBIN A1C  06/06/2023   INFLUENZA VACCINE  11/07/2023      Past Medical History:  Diagnosis Date   Allergy    PCN, Latex & Almonds   Arthritis    Cirrhosis of liver (HCC)    Depression    Dysrhythmia    tachycardia    Past Surgical History:  Procedure Laterality Date   CHOLECYSTECTOMY     gallbladder removed when liver transplant was done   FRACTURE SURGERY  August 2006   R-Wrist fracture   HERNIA REPAIR  03/04/2023 & 09/17/2023   Strangulated Hernia Repair @ [facility]   INCISIONAL HERNIA REPAIR N/A 03/04/2023   Procedure: INCARCERATED INCISIONAL HERNIA REPAIR;  Surgeon: Eletha Boas, MD;  Location: [facility];  Service: General;  Laterality: N/A;   LIVER TRANSPLANT  07/11/2020   done at [facility] in [place]   right wrist fracture Right 2006   ORIF  then screws removed after bone healed   WISDOM TOOTH EXTRACTION     XI ROBOTIC ASSISTED VENTRAL HERNIA N/A 09/17/2023   Procedure: REPAIR, HERNIA, VENTRAL, ROBOT-ASSISTED;  Surgeon: Lyndel Deward PARAS, MD;  Location: [facility];  Service: General;  Laterality: N/A;    reports that she has never smoked. She has never used smokeless tobacco. She reports that she does not currently use alcohol. She reports that she does not use drugs. family history includes Alcohol abuse in her father; Hyperthyroidism in her brother, mother, and sister. Allergies  Allergen Reactions   Almond Oil Anaphylaxis and Swelling  Other reaction(s): Lip swelling, Swelling around eyes   Penicillins Hives    Had as a child Has patient had a PCN reaction causing immediate rash, facial/tongue/throat swelling, SOB or lightheadedness with hypotension: No Has patient had a PCN reaction causing severe rash involving mucus membranes or skin necrosis: No Has patient had a PCN reaction that required hospitalization: No Has patient had a PCN reaction occurring within the last 10 years: No If all of the above answers are NO, then may proceed with Cephalosporin use.   Latex Itching   Current Outpatient Medications on File Prior to Visit  Medication Sig Dispense Refill   aspirin  EC (ASPIRIN  81) 81 MG tablet Take 1 tablet (81 mg total) by mouth daily. 90 tablet 3    tacrolimus  ER (ENVARSUS  XR) 1 MG TB24 Take 1 tablet (1 mg total) by mouth daily before breakfast. 90 tablet 3   tacrolimus  ER (ENVARSUS  XR) 4 MG TB24 Take 1 tablet (4 mg total) by mouth every morning before breakfast. 90 tablet 3   Vitamin D , Ergocalciferol , (DRISDOL ) 1.25 MG (50000 UNIT) CAPS capsule Take 1 capsule (50,000 Units total) by mouth every 7 (seven) days. 7 capsule 1   No current facility-administered medications on file prior to visit.        ROS:  All others reviewed and negative.  Objective        PE:  BP 122/84   Pulse 91   Temp 98.1 F (36.7 C)   Ht 5' 6 (1.676 m)   Wt 241 lb 3.2 oz (109.4 kg)   LMP 12/12/2016 (Exact Date)   SpO2 99%   BMI 38.93 kg/m                 Constitutional: Pt appears in NAD               HENT: Head: NCAT.                Right Ear: External ear normal.                 Left Ear: External ear normal.                Eyes: . Pupils are equal, round, and reactive to light. Conjunctivae and EOM are normal               Nose: without d/c or deformity               Neck: Neck supple. Gross normal ROM               Cardiovascular: Normal rate and regular rhythm.                 Pulmonary/Chest: Effort normal and breath sounds without rales or wheezing.                Abd:  Soft, NT, ND, + BS, no organomegaly               Neurological: Pt is alert. At baseline orientation, motor grossly intact               Skin: Skin is warm. No rashes, no other new lesions, LE edema - none               Psychiatric: Pt behavior is normal without agitation   Micro: none  Cardiac tracings I have personally interpreted today:  none  Pertinent Radiological findings (summarize): none   Lab Results  Component  Value Date   WBC 7.9 09/19/2023   HGB 9.7 (L) 09/19/2023   HCT 29.1 (L) 09/19/2023   PLT 103 (L) 09/19/2023   GLUCOSE 169 (H) 09/19/2023   CHOL 177 12/04/2022   TRIG 90.0 12/04/2022   HDL 53.90 12/04/2022   LDLCALC 105 (H) 12/04/2022   ALT 34  09/17/2023   AST 48 (H) 09/17/2023   NA 127 (L) 09/19/2023   K 3.7 09/19/2023   CL 99 09/19/2023   CREATININE 1.04 (H) 09/19/2023   BUN 23 (H) 09/19/2023   CO2 22 09/19/2023   TSH 4.03 12/26/2016   INR 1.2 09/17/2023   HGBA1C 5.6 12/04/2022   Assessment/Plan:  Alejandra Hunt Curfman is a 48 y.o. Unavailable [8] female with  has a past medical history of Allergy, Arthritis, Cirrhosis of liver (HCC), Depression, and Dysrhythmia.  Encounter for well adult exam with abnormal findings Age and sex appropriate education and counseling updated with regular exercise and diet Referrals for preventative services - for GYN referral Immunizations addressed - for Prevnar 20 today Smoking counseling  - none needed Evidence for depression or other mood disorder - none significant Most recent labs reviewed. I have personally reviewed and have noted: 1) the patient's medical and social history 2) The patient's current medications and supplements 3) The patient's height, weight, and BMI have been recorded in the chart   Anemia Most likey c/w postop ABL anemia, for f/u cbc and iron  Depression Stable overall, no indication for tx at this time  High cholesterol Lab Results  Component Value Date   LDLCALC 105 (H) 12/04/2022   Mild, for lower chol diet, declines statin  Hyponatremia Mild 127 and liekly improved, will check f/u lab  Prediabetes Lab Results  Component Value Date   HGBA1C 5.6 12/04/2022   Stable, pt to continue diet , excercise  Vitamin D  deficiency Last vitamin D  Lab Results  Component Value Date   VD25OH 17.96 (L) 07/31/2023   Low, to start oral replacement   Obesity (BMI 35.0-39.9 without comorbidity) Ok for phentermine  37.5 mg qd  Followup: Return in about 1 year (around 12/04/2024).  Lynwood Rush, MD 12/05/2023 10:08 AM [facility]

## 2023-12-05 NOTE — Assessment & Plan Note (Signed)
 Lab Results  Component Value Date   HGBA1C 5.6 12/04/2022   Stable, pt to continue diet , excercise

## 2023-12-05 NOTE — Assessment & Plan Note (Signed)
 Most likey c/w postop ABL anemia, for f/u cbc and iron

## 2023-12-05 NOTE — Assessment & Plan Note (Signed)
 Ok for phentermine  37.5 mg qd

## 2023-12-05 NOTE — Assessment & Plan Note (Signed)
 Lab Results  Component Value Date   LDLCALC 105 (H) 12/04/2022   Mild, for lower chol diet, declines statin

## 2023-12-09 ENCOUNTER — Ambulatory Visit

## 2023-12-29 ENCOUNTER — Ambulatory Visit (INDEPENDENT_AMBULATORY_CARE_PROVIDER_SITE_OTHER)

## 2023-12-29 DIAGNOSIS — Z23 Encounter for immunization: Secondary | ICD-10-CM | POA: Diagnosis not present

## 2023-12-29 NOTE — Progress Notes (Signed)
 Patient visits today to receive her FLU injection/vaccine. Patient was informed and tolerated well. Patient was notified to reach out to us  if needed.

## 2024-02-09 ENCOUNTER — Other Ambulatory Visit (HOSPITAL_BASED_OUTPATIENT_CLINIC_OR_DEPARTMENT_OTHER): Payer: Self-pay | Admitting: Emergency Medicine

## 2024-02-09 DIAGNOSIS — Z1231 Encounter for screening mammogram for malignant neoplasm of breast: Secondary | ICD-10-CM

## 2024-02-17 ENCOUNTER — Encounter

## 2024-02-19 ENCOUNTER — Ambulatory Visit: Admitting: Family Medicine

## 2024-02-19 VITALS — BP 132/80 | HR 82 | Temp 98.2°F | Ht 66.0 in | Wt 241.0 lb

## 2024-02-19 DIAGNOSIS — Z6838 Body mass index (BMI) 38.0-38.9, adult: Secondary | ICD-10-CM

## 2024-02-19 DIAGNOSIS — D84821 Immunodeficiency due to drugs: Secondary | ICD-10-CM | POA: Diagnosis not present

## 2024-02-19 DIAGNOSIS — E669 Obesity, unspecified: Secondary | ICD-10-CM | POA: Diagnosis not present

## 2024-02-19 DIAGNOSIS — Z9889 Other specified postprocedural states: Secondary | ICD-10-CM

## 2024-02-19 DIAGNOSIS — Z944 Liver transplant status: Secondary | ICD-10-CM

## 2024-02-19 DIAGNOSIS — Z8719 Personal history of other diseases of the digestive system: Secondary | ICD-10-CM | POA: Diagnosis not present

## 2024-02-19 DIAGNOSIS — G479 Sleep disorder, unspecified: Secondary | ICD-10-CM | POA: Diagnosis not present

## 2024-02-19 DIAGNOSIS — R1085 Abdominal pain of multiple sites: Secondary | ICD-10-CM

## 2024-02-19 NOTE — Progress Notes (Signed)
 Acute Office Visit  Subjective:     Patient ID: Elizabeth Norton, female    DOB: 1975/05/09, 48 y.o.   MRN: [id number]  No chief complaint on file.   HPI  Discussed the use of AI scribe software for clinical note transcription with the patient, who gave verbal consent to proceed.  History of Present Illness Elizabeth Norton is a 48 year old female who presents with abdominal pain following prior mesh surgery.  Abdominal pain and post-surgical symptoms - Severe and persistent pain localized under the abdomen and behind the umbilicus, near the site of prior mesh hernia repair - Tender and swollen area present near the previous hernia surgery site - Sensation of pinching, similar to staples, despite use of internal dissolvable sutures - Concern regarding possible mesh disruption - Persistent abdominal pouch and stretch marks in the area of prior hernia surgery - No constipation, diarrhea, or recent changes in bowel habits  Sleep disturbance - Difficulty sleeping, often limited to two hours per night - Sleep disruption attributed to abdominal and shoulder pain - Trial of melatonin without improvement - Concern about dependency on sleep aids  Weight management difficulties - Previously prescribed phentermine  37.5 mg for weight management, discontinued due to lack of efficacy - Eats small meals and rarely snacks - Struggles with slow metabolism     ROS Per HPI      Objective:    BP 132/80 (BP Location: Left Arm, Patient Position: Sitting)   Pulse 82   Temp 98.2 F (36.8 C) (Temporal)   Ht 5' 6 (1.676 m)   Wt 241 lb (109.3 kg)   LMP 12/12/2016 (Exact Date)   SpO2 98%   BMI 38.90 kg/m    Physical Exam Vitals and nursing note reviewed.  Constitutional:      General: She is not in acute distress.    Appearance: Normal appearance. She is normal weight.  HENT:     Head: Normocephalic and atraumatic.     Right Ear: External ear normal.     Left Ear: External ear  normal.     Nose: Nose normal.     Mouth/Throat:     Mouth: Mucous membranes are moist.     Pharynx: Oropharynx is clear.  Eyes:     Extraocular Movements: Extraocular movements intact.     Pupils: Pupils are equal, round, and reactive to light.  Cardiovascular:     Rate and Rhythm: Normal rate and regular rhythm.     Pulses: Normal pulses.     Heart sounds: Normal heart sounds.  Pulmonary:     Effort: Pulmonary effort is normal. No respiratory distress.     Breath sounds: Normal breath sounds. No wheezing, rhonchi or rales.  Abdominal:     General: A surgical scar is present.     Palpations: Abdomen is soft. There is no shifting dullness, fluid wave, hepatomegaly, splenomegaly, mass or pulsatile mass.     Tenderness: There is no right CVA tenderness, left CVA tenderness, guarding or rebound. Negative signs include Murphy's sign, Rovsing's sign, McBurney's sign, psoas sign and obturator sign.      Comments: Areas of tenderness with light palpation  Musculoskeletal:        General: Normal range of motion.     Cervical back: Normal range of motion.     Right lower leg: No edema.     Left lower leg: No edema.  Lymphadenopathy:     Cervical: No cervical adenopathy.  Neurological:  General: No focal deficit present.     Mental Status: She is alert and oriented to person, place, and time.  Psychiatric:        Mood and Affect: Mood normal.        Thought Content: Thought content normal.     No results found for any visits on 02/19/24.      Assessment & Plan:   Assessment and Plan Assessment & Plan Abdominal pain, multiple sites, s/p ventral hernia repair Persistent pain post-surgery, possibly due to mesh or scar tissue. Evaluating for mesh complications. - Ordered CT scan of the abdomen with contrast. - Referred to [facility] for [facility] scan.  Sleep disturbance Difficulty sleeping, likely due to pain and stress. Prefers non-pharmacological options due to liver transplant. -  Recommended trial of melatonin 5-10 mg as needed. - Advised against regular use to prevent dependency. - Discussed potential interactions with liver medications.  Obesity, BMI 35-39 Previous phentermine  trial ineffective. Discussed alternative weight management options. - Discussed potential use of Wellbutrin and Qsymia.  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 - Reviewed potential interactions of melatonin with liver medications.     Orders Placed This Encounter  Procedures   CT ABDOMEN PELVIS W CONTRAST    Standing Status:   Future    Expiration Date:   02/18/2025    If indicated for the ordered procedure, I authorize the administration of contrast media per Radiology protocol:   Yes    Does the patient have a contrast media/X-ray dye allergy?:   No    Is patient pregnant?:   No    Preferred imaging location?:   [facility]    If indicated for the ordered procedure, I authorize the administration of oral contrast media per Radiology protocol:   Yes     No orders of the defined types were placed in this encounter.   Return if symptoms worsen or fail to improve.  Elizabeth LITTIE Ku, FNP

## 2024-02-19 NOTE — Patient Instructions (Signed)
 5-10 mg melatonin as needed for sleep.

## 2024-02-20 ENCOUNTER — Encounter: Payer: Self-pay | Admitting: Family Medicine

## 2024-02-24 ENCOUNTER — Inpatient Hospital Stay
Admission: RE | Admit: 2024-02-24 | Discharge: 2024-02-24 | Disposition: A | Source: Ambulatory Visit | Attending: Family Medicine

## 2024-02-24 DIAGNOSIS — Z944 Liver transplant status: Secondary | ICD-10-CM

## 2024-02-24 DIAGNOSIS — N281 Cyst of kidney, acquired: Secondary | ICD-10-CM | POA: Diagnosis not present

## 2024-02-24 DIAGNOSIS — R1085 Abdominal pain of multiple sites: Secondary | ICD-10-CM

## 2024-02-24 MED ORDER — IOPAMIDOL (ISOVUE-300) INJECTION 61%
100.0000 mL | Freq: Once | INTRAVENOUS | Status: AC | PRN
  Administered 2024-02-24: 100 mL via INTRAVENOUS

## 2024-03-01 ENCOUNTER — Other Ambulatory Visit: Payer: Self-pay

## 2024-03-03 ENCOUNTER — Ambulatory Visit: Payer: Self-pay | Admitting: Family Medicine

## 2024-03-03 ENCOUNTER — Other Ambulatory Visit (HOSPITAL_COMMUNITY): Payer: Self-pay

## 2024-03-05 ENCOUNTER — Other Ambulatory Visit: Payer: Self-pay

## 2024-03-10 ENCOUNTER — Other Ambulatory Visit (HOSPITAL_COMMUNITY): Payer: Self-pay

## 2024-03-12 ENCOUNTER — Other Ambulatory Visit: Payer: Self-pay

## 2024-03-12 ENCOUNTER — Other Ambulatory Visit (HOSPITAL_COMMUNITY): Payer: Self-pay

## 2024-03-12 NOTE — Progress Notes (Signed)
 Specialty Pharmacy Refill Coordination Note  Elizabeth Norton is a 48 y.o. female contacted today regarding refills of specialty medication(s) Tacrolimus  (Envarsus  XR)   Patient requested [name] at [facility] [place] date: 03/12/24   Medication will be filled on: 03/12/24

## 2024-03-15 ENCOUNTER — Ambulatory Visit: Admitting: Obstetrics and Gynecology

## 2024-03-15 ENCOUNTER — Other Ambulatory Visit (HOSPITAL_COMMUNITY)
Admission: RE | Admit: 2024-03-15 | Discharge: 2024-03-15 | Disposition: A | Discharge status: Home / Self Care | Source: Ambulatory Visit | Attending: Obstetrics and Gynecology | Admitting: Obstetrics and Gynecology

## 2024-03-15 ENCOUNTER — Encounter (HOSPITAL_BASED_OUTPATIENT_CLINIC_OR_DEPARTMENT_OTHER): Payer: Self-pay

## 2024-03-15 ENCOUNTER — Inpatient Hospital Stay (HOSPITAL_BASED_OUTPATIENT_CLINIC_OR_DEPARTMENT_OTHER): Admission: RE | Admit: 2024-03-15 | Discharge: 2024-03-15 | Attending: Emergency Medicine

## 2024-03-15 VITALS — BP 115/66 | HR 106 | Ht 66.0 in | Wt 239.1 lb

## 2024-03-15 DIAGNOSIS — Z1231 Encounter for screening mammogram for malignant neoplasm of breast: Secondary | ICD-10-CM

## 2024-03-15 DIAGNOSIS — Z01419 Encounter for gynecological examination (general) (routine) without abnormal findings: Secondary | ICD-10-CM

## 2024-03-15 DIAGNOSIS — Z944 Liver transplant status: Secondary | ICD-10-CM | POA: Diagnosis not present

## 2024-03-15 DIAGNOSIS — Z124 Encounter for screening for malignant neoplasm of cervix: Secondary | ICD-10-CM | POA: Diagnosis not present

## 2024-03-15 NOTE — Progress Notes (Signed)
 Patient presents for Annual.  LMP: Patient's last menstrual period was 12/12/2016 (exact date).  Last pap: 05/30/2020 Contraception: None Mammogram: Due, last mammogram: 03/15/2024 STD Screening: Declines Flu Vaccine : N/A pts works for [employer] had flu shot   CC:   Fun Fact:

## 2024-03-15 NOTE — Progress Notes (Signed)
 ANNUAL GYNECOLOGY VISIT Chief Complaint  Patient presents with   Gynecologic Exam    Annual with pap     Subjective:  Elizabeth Norton is a 48 y.o. No obstetric history on file. who presents for annual exam.  Doing well, no concerns. Hx liver transplant 2022  Gyn History: Patient's last menstrual period was 12/12/2016 (exact date). Last pap: No results found for: DIAGPAP, HPV, HPVHIGH History of abnormal pap: No Periods: none x several years  Last mammogram: this morning Last colonoscopy: 2022   The pregnancy intention screening data noted above was reviewed. Potential methods of contraception were discussed. The patient elected to proceed with No data recorded.       02/19/2024    4:16 PM 12/05/2023    9:26 AM 12/11/2022    9:52 AM 12/04/2022    9:41 AM  Depression screen PHQ 2/9  Decreased Interest 0 0 0 0  Down, Depressed, Hopeless 0 0 0 0  PHQ - 2 Score 0 0 0 0  Altered sleeping 0     Tired, decreased energy 0     Change in appetite 0     Feeling bad or failure about yourself  0     Trouble concentrating 0     Moving slowly or fidgety/restless 0     Suicidal thoughts 0     PHQ-9 Score 0     Difficult doing work/chores Not difficult at all            No data to display            OB History   No obstetric history on file.     Past Medical History:  Diagnosis Date   Allergy    PCN, Latex & Almonds   Arthritis    Cirrhosis of liver (HCC)    Depression    Dysrhythmia    tachycardia    Past Surgical History:  Procedure Laterality Date   CHOLECYSTECTOMY     gallbladder removed when liver transplant was done   FRACTURE SURGERY  August 2006   R-Wrist fracture   HERNIA REPAIR  03/04/2023 & 09/17/2023   Strangulated Hernia Repair @ [facility]   INCISIONAL HERNIA REPAIR N/A 03/04/2023   Procedure: INCARCERATED INCISIONAL HERNIA REPAIR;  Surgeon: Eletha Boas, MD;  Location: [facility];  Service: General;  Laterality: N/A;    LIVER TRANSPLANT  07/11/2020   done at [facility] in [place]   right wrist fracture Right 2006   ORIF  then screws removed after bone healed   WISDOM TOOTH EXTRACTION     XI ROBOTIC ASSISTED VENTRAL HERNIA N/A 09/17/2023   Procedure: REPAIR, HERNIA, VENTRAL, ROBOT-ASSISTED;  Surgeon: Lyndel Deward PARAS, MD;  Location: [facility];  Service: General;  Laterality: N/A;    Social History   Socioeconomic History   Marital status: Married    Spouse name: Not on file   Number of children: Not on file   Years of education: Not on file   Highest education level: Associate degree: occupational, scientist, product/process development, or vocational program  Occupational History   Not on file  Tobacco Use   Smoking status: Never   Smokeless tobacco: Never  Vaping Use   Vaping status: Never Used  Substance and Sexual Activity   Alcohol use: Not Currently    Comment: I only drink 1-2 glasses of Wine on Special occasions   Drug use: Never   Sexual activity: Yes  Other Topics Concern  Not on file  Social History Narrative   Not on file   Social Drivers of Health   Financial Resource Strain: Medium Risk (02/19/2024)   Overall Financial Resource Strain (CARDIA)    Difficulty of Paying Living Expenses: Somewhat hard  Food Insecurity: No Food Insecurity (02/19/2024)   Hunger Vital Sign    Worried About Running Out of Food in the Last Year: Never true    Ran Out of Food in the Last Year: Never true  Transportation Needs: No Transportation Needs (02/19/2024)   PRAPARE - Administrator, Civil Service (Medical): No    Lack of Transportation (Non-Medical): No  Physical Activity: Inactive (02/19/2024)   Exercise Vital Sign    Days of Exercise per Week: 0 days    Minutes of Exercise per Session: Not on file  Stress: Stress Concern Present (02/19/2024)   [employer] of Occupational Health - Occupational Stress Questionnaire    Feeling of Stress: To some extent  Social Connections: Moderately  Isolated (02/19/2024)   Social Connection and Isolation Panel    Frequency of Communication with Friends and Family: More than three times a week    Frequency of Social Gatherings with Friends and Family: Never    Attends Religious Services: Never    Database Administrator or Organizations: No    Attends Engineer, Structural: Not on file    Marital Status: Married    Family History  Problem Relation Age of Onset   Hyperthyroidism Mother    Alcohol abuse Father    Hyperthyroidism Sister    Hyperthyroidism Brother     Current Outpatient Medications on File Prior to Visit  Medication Sig Dispense Refill   aspirin  EC (ASPIRIN  81) 81 MG tablet Take 1 tablet (81 mg total) by mouth daily. 90 tablet 3   tacrolimus  ER (ENVARSUS  XR) 1 MG TB24 Take 1 tablet (1 mg total) by mouth daily before breakfast. 90 tablet 3   tacrolimus  ER (ENVARSUS  XR) 4 MG TB24 Take 1 tablet (4 mg total) by mouth every morning before breakfast. 90 tablet 3   No current facility-administered medications on file prior to visit.    Allergies  Allergen Reactions   Almond Oil Anaphylaxis and Swelling    Other reaction(s): Lip swelling, Swelling around eyes   Penicillins Hives    Had as a child Has patient had a PCN reaction causing immediate rash, facial/tongue/throat swelling, SOB or lightheadedness with hypotension: No Has patient had a PCN reaction causing severe rash involving mucus membranes or skin necrosis: No Has patient had a PCN reaction that required hospitalization: No Has patient had a PCN reaction occurring within the last 10 years: No If all of the above answers are NO, then may proceed with Cephalosporin use.   Latex Itching   Pneumococcal Vaccine Rash    Patient noted rash and redness at the site of the injection. Present for several days post injection     Objective:   Vitals:   03/15/24 1113  BP: 115/66  Pulse: (!) 106  Weight: 239 lb 1.9 oz (108.5 kg)  Height: 5' 6 (1.676 m)    Physical Examination:   General appearance - well appearing, and in no distress  Mental status - alert, oriented to person, place, and time  Psych:  normal mood and affect  Skin - warm and dry, normal color, no suspicious lesions noted  Breasts - breasts appear normal, no suspicious masses, no skin or nipple changes or  axillary nodes  Abdomen - soft, nontender, nondistended, no masses or organomegaly  Pelvic -  VULVA: normal appearing vulva with no masses, tenderness or lesions   VAGINA: normal appearing vagina with normal color and discharge, no lesions   CERVIX: normal appearing cervix without discharge or lesions, no CMT  Thin prep pap is done with HR HPV cotesting  UTERUS: uterus is felt to be normal size, shape, consistency and nontender   ADNEXA: No adnexal masses or tenderness noted.  Extremities:  No swelling or varicosities noted  Chaperone present for exam  Assessment and Plan:  1. Encounter for well woman exam with routine gynecological exam (Primary) Pap/HPV Normal clinical breast exam, reviewed self breast exams, mammograms yearly Colonoscopy up to date    2. Cervical cancer screening - Cytology - PAP  3. History of liver transplant (HCC) Recommend yearly pap given this history   No future appointments.  Rollo ONEIDA Bring, MD, FACOG Obstetrician & Gynecologist, [facility] for [facility], [facility]

## 2024-03-17 ENCOUNTER — Other Ambulatory Visit (HOSPITAL_COMMUNITY): Payer: Self-pay

## 2024-03-17 ENCOUNTER — Ambulatory Visit: Payer: Self-pay | Admitting: Obstetrics and Gynecology

## 2024-03-17 LAB — CYTOLOGY - PAP
Comment: NEGATIVE
Diagnosis: NEGATIVE
Diagnosis: REACTIVE
High risk HPV: NEGATIVE

## 2024-03-17 MED ORDER — ASPIRIN 81 MG PO TBEC
81.0000 mg | DELAYED_RELEASE_TABLET | Freq: Every day | ORAL | 3 refills | Status: AC
  Filled 2024-03-17: qty 90, 90d supply, fill #0

## 2024-03-22 ENCOUNTER — Other Ambulatory Visit (HOSPITAL_COMMUNITY): Payer: Self-pay

## 2024-04-19 ENCOUNTER — Other Ambulatory Visit: Payer: Self-pay

## 2024-04-26 ENCOUNTER — Other Ambulatory Visit (HOSPITAL_COMMUNITY): Payer: Self-pay

## 2024-04-28 ENCOUNTER — Other Ambulatory Visit (HOSPITAL_COMMUNITY): Payer: Self-pay
